# Patient Record
Sex: Male | Born: 1955 | Race: White | Hispanic: No | Marital: Married | Smoking: Never smoker
Health system: Southern US, Community
[De-identification: ages and names within clinical notes are randomized; demographics above are authoritative.]

## PROBLEM LIST (undated history)

## (undated) DIAGNOSIS — Z950 Presence of cardiac pacemaker: Secondary | ICD-10-CM

## (undated) DIAGNOSIS — I495 Sick sinus syndrome: Secondary | ICD-10-CM

## (undated) DIAGNOSIS — K648 Other hemorrhoids: Secondary | ICD-10-CM

## (undated) DIAGNOSIS — M519 Unspecified thoracic, thoracolumbar and lumbosacral intervertebral disc disorder: Secondary | ICD-10-CM

## (undated) DIAGNOSIS — I493 Ventricular premature depolarization: Secondary | ICD-10-CM

## (undated) DIAGNOSIS — Z85828 Personal history of other malignant neoplasm of skin: Secondary | ICD-10-CM

## (undated) DIAGNOSIS — E785 Hyperlipidemia, unspecified: Secondary | ICD-10-CM

## (undated) DIAGNOSIS — C801 Malignant (primary) neoplasm, unspecified: Secondary | ICD-10-CM

## (undated) DIAGNOSIS — C44319 Basal cell carcinoma of skin of other parts of face: Secondary | ICD-10-CM

## (undated) DIAGNOSIS — T4145XA Adverse effect of unspecified anesthetic, initial encounter: Secondary | ICD-10-CM

## (undated) DIAGNOSIS — T8859XA Other complications of anesthesia, initial encounter: Secondary | ICD-10-CM

## (undated) DIAGNOSIS — Z9889 Other specified postprocedural states: Secondary | ICD-10-CM

## (undated) DIAGNOSIS — I499 Cardiac arrhythmia, unspecified: Secondary | ICD-10-CM

## (undated) HISTORY — DX: Other specified postprocedural states: Z98.890

## (undated) HISTORY — DX: Presence of cardiac pacemaker: Z95.0

## (undated) HISTORY — DX: Other hemorrhoids: K64.8

## (undated) HISTORY — PX: INSERT / REPLACE / REMOVE PACEMAKER: SUR710

## (undated) HISTORY — DX: Sick sinus syndrome: I49.5

## (undated) HISTORY — PX: EXCISIONAL HEMORRHOIDECTOMY: SHX1541

## (undated) HISTORY — DX: Unspecified thoracic, thoracolumbar and lumbosacral intervertebral disc disorder: M51.9

## (undated) HISTORY — DX: Malignant (primary) neoplasm, unspecified: C80.1

## (undated) HISTORY — DX: Cardiac arrhythmia, unspecified: I49.9

## (undated) HISTORY — DX: Personal history of other malignant neoplasm of skin: Z85.828

## (undated) HISTORY — DX: Hyperlipidemia, unspecified: E78.5

## (undated) HISTORY — DX: Ventricular premature depolarization: I49.3

## (undated) HISTORY — PX: NECK SURGERY: SHX720

---

## 1996-03-29 HISTORY — PX: OTHER SURGICAL HISTORY: SHX169

## 2002-12-18 ENCOUNTER — Ambulatory Visit (HOSPITAL_COMMUNITY): Admission: RE | Admit: 2002-12-18 | Discharge: 2002-12-18 | Payer: Self-pay | Admitting: Neurosurgery

## 2002-12-18 ENCOUNTER — Encounter: Payer: Self-pay | Admitting: Neurosurgery

## 2002-12-18 HISTORY — PX: BACK SURGERY: SHX140

## 2003-01-28 HISTORY — PX: CHOLECYSTECTOMY: SHX55

## 2003-11-28 HISTORY — PX: INCISION AND DRAINAGE PERIRECTAL ABSCESS: SHX1804

## 2004-02-24 ENCOUNTER — Ambulatory Visit: Payer: Self-pay | Admitting: General Surgery

## 2006-05-05 ENCOUNTER — Ambulatory Visit: Payer: Self-pay | Admitting: Rheumatology

## 2007-01-20 ENCOUNTER — Ambulatory Visit: Payer: Self-pay | Admitting: Unknown Physician Specialty

## 2008-10-02 ENCOUNTER — Ambulatory Visit: Payer: Self-pay | Admitting: Rheumatology

## 2009-01-23 ENCOUNTER — Inpatient Hospital Stay: Payer: Self-pay | Admitting: Rheumatology

## 2009-01-24 HISTORY — PX: OTHER SURGICAL HISTORY: SHX169

## 2011-05-28 DIAGNOSIS — C44319 Basal cell carcinoma of skin of other parts of face: Secondary | ICD-10-CM

## 2011-05-28 HISTORY — DX: Basal cell carcinoma of skin of other parts of face: C44.319

## 2012-10-19 HISTORY — PX: COLONOSCOPY: SHX174

## 2015-04-09 DIAGNOSIS — Z950 Presence of cardiac pacemaker: Secondary | ICD-10-CM | POA: Insufficient documentation

## 2015-04-09 DIAGNOSIS — I495 Sick sinus syndrome: Secondary | ICD-10-CM | POA: Insufficient documentation

## 2015-04-09 DIAGNOSIS — I493 Ventricular premature depolarization: Secondary | ICD-10-CM | POA: Insufficient documentation

## 2015-04-12 ENCOUNTER — Emergency Department
Admission: EM | Admit: 2015-04-12 | Discharge: 2015-04-12 | Disposition: A | Discharge status: Home / Self Care | Payer: BC Managed Care – PPO | Attending: Emergency Medicine | Admitting: Emergency Medicine

## 2015-04-12 ENCOUNTER — Encounter: Payer: Self-pay | Admitting: Emergency Medicine

## 2015-04-12 DIAGNOSIS — Z8719 Personal history of other diseases of the digestive system: Secondary | ICD-10-CM | POA: Diagnosis not present

## 2015-04-12 DIAGNOSIS — K610 Anal abscess: Secondary | ICD-10-CM | POA: Diagnosis not present

## 2015-04-12 DIAGNOSIS — K6289 Other specified diseases of anus and rectum: Secondary | ICD-10-CM | POA: Diagnosis present

## 2015-04-12 MED ORDER — LIDOCAINE-EPINEPHRINE (PF) 1 %-1:200000 IJ SOLN
30.0000 mL | Freq: Once | INTRAMUSCULAR | Status: AC
  Administered 2015-04-12: 30 mL

## 2015-04-12 MED ORDER — LIDOCAINE-EPINEPHRINE (PF) 1 %-1:200000 IJ SOLN
INTRAMUSCULAR | Status: AC
  Administered 2015-04-12: 30 mL
  Filled 2015-04-12: qty 30

## 2015-04-12 NOTE — ED Notes (Addendum)
MD Dr. Tamala Julian at bedside for examination. Pt noted to have tenderness on palpation to R perianal area. No gross redness/swelling/heat noted.

## 2015-04-12 NOTE — Op Note (Signed)
Diagnosis perianal abscess  Procedure incision and drainage perianal abscess  Anesthesia local 1% Xylocaine with epinephrine  Surgeon Rochel Brome M.D.  With the patient on the stretcher in the left lateral decubitus position the anal area was prepared with Betadine solution and used a fenestrated drape. The right buttock was retracted for exposure. The site of tenderness and firmness just to the right of the anal os was infiltrated with 1% Xylocaine with epinephrine. A lancing 1 cm incision was made and lanced deeply as far as 2 cm into the hard palpable mass there was bloody discharge no actual yellow pus was seen. Further exploration was carried out with spreading with a hemostat and lanced  2 additional times into the firm area. There was bloody discharge and pressure was held for several minutes and with time it appeared that bleeding resolved.  The patient was allowed to rest on the stretcher in the same position for another 10 minutes and then change the dressing and found no further bleeding. The site was cleaned with saline and apply dry cotton gauze with 2 inch paper tape  The patient appear to be in satisfactory condition and plans made for discharge to home with follow-up in the office.

## 2015-04-12 NOTE — ED Notes (Signed)
I&D done by Dr. Tamala Julian, no purulent drainage noted at this time. Small amount bleeding, gauze applied, pressure held. Bleeding stopped at this time. 4x4 gauze and paper tape dressing applied by MD.

## 2015-04-12 NOTE — Discharge Instructions (Signed)
Change dressing later today or tomorrow. May sit in warm water as desired for comfort and cleaning. Change dressing once or twice a day until completely dry.  Take Tylenol and/or ibuprofen if needed for pain.  Call office for follow-up if pain persists.  Call office for follow-up if bleeding persists to consider roll of rubber band ligation

## 2015-04-12 NOTE — Progress Notes (Signed)
He comes into the emergency room with chief complaint of right side perianal pain. He reports that in the last 2 days has developed pain at this site. He has had an abscess 2 times in the past which required incision and drainage. He has previous history of anal fissurectomy and lateral internal anal sphincterotomy. He has a previous history of multiple anal skin tags. He was in the office 2 days ago for evaluation of pain. He did have some history of bright red rectal bleeding. An anoscopy was done 2 days ago demonstrating enlargement of internal hemorrhoids. He reports difficulty sitting and has to raise up on the left side. He has moved his bowels 2 times yesterday without constipation. He does often have some scant leakage of feculent material.  On examination he is awake alert and oriented and in minimal distress. Temperature 90.8, blood pressure 132/76, pulse rate 77. Anal examination demonstrated small bowel large and small external skin tag's. There is tenderness on the patient's right side and a localized area of firmness. Is no erythema of the skin. Digital examination demonstrates good sphincter tone and no palpable mass  Impression possible perianal abscess  I recommended incision and drainage  See operative report.  Final diagnosis perianal abscess, history of rectal bleeding and internal hemorrhoids  Plans include dressing changes at home sitz baths take Tylenol and or ibuprofen if needed follow-up in the office as needed.  We have previously discussed the role of internal hemorrhoid rubber band ligation but decision not yet made about rubber band ligation

## 2015-04-12 NOTE — ED Notes (Signed)
Per report, discharge instructions done by Shirlee Limerick, RN. This nurse in room with patient when Dr. Tamala Julian gave his home care instructions. Pt has no further questions at this time.

## 2015-04-12 NOTE — ED Provider Notes (Signed)
I was not involved in the care of this patient. This patient was taken care of by Dr. Rochel Brome, general surgery. I did not perform a history or physical exam on this patient.  Earleen Newport, MD 04/12/15 [phone]

## 2015-04-12 NOTE — ED Notes (Signed)
Pt to ed with c/o rectal pain and bleeding x 1 week.  Pt states he was seen by dr Tamala Julian this past week

## 2015-04-13 ENCOUNTER — Emergency Department: Payer: BC Managed Care – PPO

## 2015-04-13 ENCOUNTER — Emergency Department
Admission: EM | Admit: 2015-04-13 | Discharge: 2015-04-13 | Disposition: A | Discharge status: Home / Self Care | Payer: BC Managed Care – PPO | Attending: Emergency Medicine | Admitting: Emergency Medicine

## 2015-04-13 DIAGNOSIS — Z7982 Long term (current) use of aspirin: Secondary | ICD-10-CM | POA: Diagnosis not present

## 2015-04-13 DIAGNOSIS — K610 Anal abscess: Secondary | ICD-10-CM

## 2015-04-13 DIAGNOSIS — K611 Rectal abscess: Secondary | ICD-10-CM | POA: Diagnosis present

## 2015-04-13 LAB — BASIC METABOLIC PANEL
Anion gap: 5 (ref 5–15)
BUN: 14 mg/dL (ref 6–20)
CO2: 26 mmol/L (ref 22–32)
Calcium: 8.7 mg/dL — ABNORMAL LOW (ref 8.9–10.3)
Chloride: 107 mmol/L (ref 101–111)
Creatinine, Ser: 0.96 mg/dL (ref 0.61–1.24)
GFR calc Af Amer: >60 mL/min (ref >60)
GFR calc non Af Amer: >60 mL/min (ref >60)
Glucose, Bld: 113 mg/dL — ABNORMAL HIGH (ref 65–99)
Potassium: 3.9 mmol/L (ref 3.5–5.1)
Sodium: 138 mmol/L (ref 135–145)

## 2015-04-13 LAB — CBC WITH DIFFERENTIAL/PLATELET
Basophils Absolute: 0.1 10*3/uL (ref 0–0.1)
Basophils Relative: 1 %
Eosinophils Absolute: 0.2 10*3/uL (ref 0–0.7)
Eosinophils Relative: 1 %
HCT: 43.7 % (ref 40.0–52.0)
Hemoglobin: 14.5 g/dL (ref 13.0–18.0)
Lymphocytes Relative: 8 %
Lymphs Abs: 1.3 10*3/uL (ref 1.0–3.6)
MCH: 27.6 pg (ref 26.0–34.0)
MCHC: 33.2 g/dL (ref 32.0–36.0)
MCV: 83 fL (ref 80.0–100.0)
Monocytes Absolute: 1.3 10*3/uL — ABNORMAL HIGH (ref 0.2–1.0)
Monocytes Relative: 8 %
Neutro Abs: 12.8 10*3/uL — ABNORMAL HIGH (ref 1.4–6.5)
Neutrophils Relative %: 82 %
Platelets: 162 10*3/uL (ref 150–440)
RBC: 5.26 MIL/uL (ref 4.40–5.90)
RDW: 14.1 % (ref 11.5–14.5)
WBC: 15.6 10*3/uL — ABNORMAL HIGH (ref 3.8–10.6)

## 2015-04-13 MED ORDER — LIDOCAINE-EPINEPHRINE (PF) 1 %-1:200000 IJ SOLN
INTRAMUSCULAR | Status: AC
  Administered 2015-04-13: 30 mL
  Filled 2015-04-13: qty 30

## 2015-04-13 MED ORDER — HYDROMORPHONE HCL 1 MG/ML IJ SOLN
1.0000 mg | Freq: Once | INTRAMUSCULAR | Status: AC
  Administered 2015-04-13: 1 mg via INTRAVENOUS
  Filled 2015-04-13: qty 1

## 2015-04-13 MED ORDER — IOHEXOL 300 MG/ML  SOLN
100.0000 mL | Freq: Once | INTRAMUSCULAR | Status: AC | PRN
  Administered 2015-04-13: 100 mL via INTRAVENOUS

## 2015-04-13 MED ORDER — LIDOCAINE-EPINEPHRINE (PF) 2 %-1:200000 IJ SOLN
20.0000 mL | Freq: Once | INTRAMUSCULAR | Status: DC
  Filled 2015-04-13: qty 20

## 2015-04-13 MED ORDER — IOHEXOL 240 MG/ML SOLN
25.0000 mL | INTRAMUSCULAR | Status: AC
  Administered 2015-04-13 (×2): 25 mL via ORAL

## 2015-04-13 MED ORDER — ONDANSETRON HCL 4 MG/2ML IJ SOLN
4.0000 mg | Freq: Once | INTRAMUSCULAR | Status: AC
  Administered 2015-04-13: 4 mg via INTRAVENOUS
  Filled 2015-04-13: qty 2

## 2015-04-13 NOTE — ED Notes (Addendum)
Pt seen her yesterday for perirectal abscess. States she had it I&D yesterday with minimal drainage. Denies fever. Denies blood in stool. Continued pain and swelling

## 2015-04-13 NOTE — ED Notes (Signed)
Pt transported to CT ?

## 2015-04-13 NOTE — Progress Notes (Signed)
He returned to the emergency room today with chief complaint of anal pain. He was here yesterday and had an incision and drainage of perianal abscess but only obtained bloody discharge. He has continued to have pain. He was initially seen by the emergency room staff and referred for surgical consultation. He had CT scan today which I have reviewed the images which demonstrated a 2.5 cm abscess at 9:00 position in the perianal area. He has had a small bowel movement since yesterday. He reports no chills or fever. He has received an injection of Dilaudid which help to ease the pain.  On examination he was awake alert and oriented and in a mild degree of distress. External anal appearance demonstrated swelling on the right side. There were multiple skin tags. There was no erythema identified. There was point tenderness at approximately 9:00 position of the anoderm. Ultrasound was used to examine this area and did demonstrate an area of hypoechogenicity.  CBC with a white blood count of 15,000  Diagnosis perianal abscess  Incision and drainage was accomplished. See operative report.  Instructions were given for wound care to include dressing changes and sitting in warm water. Plan is to follow-up in the office in 10 days to remove a Penrose drain.

## 2015-04-13 NOTE — Discharge Instructions (Signed)
Perianal Abscess °An abscess is an infected area that contains a collection of pus and debris. A perianal abscess is one that occurs in the perineal area, which is the area between the anus and the scrotum in males and between the anus and the vagina in females. Perianal abscesses can vary in size. Without treatment, a perianal abscess can become larger and cause other problems. °CAUSES  °Glands in the perineal area can become plugged up with debris. When this happens, an abscess may form.  °SIGNS AND SYMPTOMS  °The most common symptoms of a perianal abscess are: °· Swelling and redness in the area of the abscess. The redness may go beyond the abscess and appear as a red streak on the skin. °· Pain in the area of the abscess. °Other possible symptoms include:  °· A visible lump or a lump that can be felt when touching the area and is usually painful. °· Bleeding or pus-like discharge from the area. °· Fever. °· General weakness. °DIAGNOSIS  °Your health care provider will take a medical history and examine the area. This may involve examining the rectal area with a gloved hand (digital rectal exam). For women, it may require a careful vaginal exam. Sometimes, the health care provider needs to look into the rectum using a probe or scope. °TREATMENT  °Treatment often requires making a cut (incision) in the abscess to drain the pus. This can sometimes be done in your health care provider's office or an emergency department after giving you medicine to numb the area (local anesthetic). For larger or deeper abscesses, surgery may be required to drain the abscess. Antibiotic medicines are sometimes given if there is infection of the surrounding tissue (cellulitis). In some cases, gauze is packed into the abscess to continue draining the area. Frequent sitz baths may be recommended to help the wound heal and to reduce the chance of the abscess coming back. °HOME CARE INSTRUCTIONS  °· Only take over-the-counter or  prescription medicines for pain, fever, or discomfort as directed by your health care provider. °· Take antibiotic medicine as directed. Make sure you finish it even if you start to feel better. °· If gauze is used in the abscess, follow your health care provider's instructions for removing or changing the gauze. It can usually be removed in 2-3 days. °· If one or more drains have been placed in the abscess cavity, be careful not to pull at them. Your health care provider will tell you how long they need to remain in place. °· Take warm sitz baths 3-4 times a day and after bowel movements. This will help reduce pain and swelling. °· Keep the skin around the abscess clean and dry. Avoid cleaning the area too much. °· Avoid scratching the abscess area. °· Avoid using colored or perfumed toilet papers. °SEEK MEDICAL CARE IF:  °· You have trouble having a bowel movement or passing urine. °· Your pain or swelling in the affected area does not seem to be improving. °· The gauze packing or the drains come out before the planned time. °SEEK IMMEDIATE MEDICAL CARE IF:  °· You have problems moving or using your legs. °· You have severe or increasing pain. °· Your swelling in the affected area suddenly gets worse. °· You have a large increase in bleeding or passing of pus. °· You have chills or a fever. °MAKE SURE YOU:  °· Understand these instructions. °· Will watch your condition. °· Will get help right away if you are   not doing well or get worse.   This information is not intended to replace advice given to you by your health care provider. Make sure you discuss any questions you have with your health care provider.   Document Released: 04/21/2006 Document Revised: 01/03/2013 Document Reviewed: 10/25/2012 Elsevier Interactive Patient Education 2016 [place] dressing either later today or tomorrow.  May sit in warm water and or shower.  Taut gauze or pad into underwear and change as needed for  drainage.

## 2015-04-13 NOTE — Op Note (Signed)
OPERATIVE REPORT  PREOPERATIVE  DIAGNOSIS: . Perianal abscess  POSTOPERATIVE DIAGNOSIS: . Perianal abscess  PROCEDURE: . Incision and drainage of perianal abscess  ANESTHESIA:  General  SURGEON: Rochel Brome  MD   INDICATIONS: . Localized pain and tenderness. CT findings of perianal abscess  With the patient on the stretcher in the right lateral decubitus position the anal area was prepared with Betadine solution. A towel was placed at the dependent portion of the operative field. The left buttock was retracted for exposure. The anoderm was infiltrated with 1% Xylocaine with epinephrine. A [place] incision was made just approximately 1 cm from the previous incision and incise deeply approximately 2 cm to discover of abrupt drainage of a yellow brown and black foul-smelling pus. There was approximately 4-5 cm of purulent drainage. Minimal bleeding. The tract was dilated with a hemostat. A quarter-inch Penrose drain was inserted and held in place with a 3-0 nylon stitch. The site was cleaned with saline and dressed with folded cotton gauze and 2 inch paper tape  The patient tolerated the procedure satisfactorily and discharged plans were made.

## 2015-04-13 NOTE — ED Notes (Signed)
Pt reports perianal abscess.  Seen yesterday and i&d by surgeon.  No improvement.  Skin  W/d with good color, ambulates well.

## 2015-04-13 NOTE — ED Provider Notes (Signed)
Time Seen: Approximately *0933  I have reviewed the triage notes  Chief Complaint: Abscess   History of Present Illness: Bob Schmidt is a 60 y.o. male *who states he was here yesterday and had incision and drainage of what was felt to be a perirectal abscess. Review of the note shows very little drainage and the patient was discharged home. Patient's currently not on any antibiotics. He states the area of the was swollen and seems to have gotten larger and more painful. He states he has difficulty finding a comfortable position at home. He denies any fever at this time with normal bowel movements, normal urination. He has a surgical history of an anal fissure or repair and states that he's had difficulties in that perirectal area since that time. Patient was also seen in the office and had an anoscopic the and exam done at that time. His surgeon is Dr. Rochel Brome   Past Medical History  Diagnosis Date  . PVC (premature ventricular contraction)   . Sinus node dysfunction (HCC)   . Cardiac pacemaker   . Hyperlipidemia   . Lumbar disc disease   . Internal hemorrhoids   . History of basal cell carcinoma   . H/O basal cell carcinoma excision   . Arrhythmia   . Cancer [facility])     Patient Active Problem List   Diagnosis Date Noted  . PVC (premature ventricular contraction)   . Sinus node dysfunction (HCC)   . Cardiac pacemaker     Past Surgical History  Procedure Laterality Date  . Anal fissure repair with partial lateral sphincterotomy  1998  . Back surgery  12/18/2002  . Incision and drainage perirectal abscess  11/2003  . Insert/replace/remove pacemaker  01/24/2009  . Colonoscopy  10/19/2012  . Cholecystectomy  01/2003  . Insert / replace / remove pacemaker      Past Surgical History  Procedure Laterality Date  . Anal fissure repair with partial lateral sphincterotomy  1998  . Back surgery  12/18/2002  . Incision and drainage perirectal abscess  11/2003  .  Insert/replace/remove pacemaker  01/24/2009  . Colonoscopy  10/19/2012  . Cholecystectomy  01/2003  . Insert / replace / remove pacemaker      Current Outpatient Rx  Name  Route  Sig  Dispense  Refill  . aspirin EC 81 MG tablet   Oral   Take 81 mg by mouth daily.           Allergies:  Review of patient's allergies indicates no known allergies.  Family History: Family History  Problem Relation Age of Onset  . Sudden death Father   . Diabetes type II Sister     Social History: Social History  Substance Use Topics  . Smoking status: Never Smoker   . Smokeless tobacco: Never Used  . Alcohol Use: Yes     Review of Systems:   10 point review of systems was performed and was otherwise negative:  Constitutional: No fever Eyes: No visual disturbances ENT: No sore throat, ear pain Cardiac: No chest pain Respiratory: No shortness of breath, wheezing, or stridor Abdomen: No abdominal pain, no vomiting, No diarrhea Endocrine: No weight loss, No night sweats Extremities: No peripheral edema, cyanosis Skin: No rashes, easy bruising Neurologic: No focal weakness, trouble with speech or swollowing Urologic: No dysuria, Hematuria, or urinary frequency  Physical Exam:  ED Triage Vitals  Enc Vitals Group     BP 04/13/15 0920 112/83 mmHg  Pulse Rate 04/13/15 0920 110     Resp 04/13/15 0920 16     Temp 04/13/15 0920 97.6 F (36.4 C)     Temp Source 04/13/15 0920 Oral     SpO2 04/13/15 0920 98 %     Weight 04/13/15 0920 230 lb (104.327 kg)     Height 04/13/15 0920 6\' 1"  (1.854 m)     Head Cir --      Peak Flow --      Pain Score 04/13/15 0920 7     Pain Loc --      Pain Edu? --      Excl. in [place]? --     General: Awake , Alert , and Oriented times 3; GCS 15 Head: Normal cephalic , atraumatic Eyes: Pupils equal , round, reactive to light Nose/Throat: No nasal drainage, patent upper airway without erythema or exudate.  Neck: Supple, Full range of motion, No  anterior adenopathy or palpable thyroid masses Lungs: Clear to ascultation without wheezes , rhonchi, or rales Heart: Regular rate, regular rhythm without murmurs , gallops , or rubs Abdomen: Soft, non tender without rebound, guarding , or rigidity; bowel sounds positive and symmetric in all 4 quadrants. No organomegaly .        Extremities: 2 plus symmetric pulses. No edema, clubbing or cyanosis Neurologic: normal ambulation, Motor symmetric without deficits, sensory intact Skin: warm, dry, no rashes Rectal exam shows a large indurated area on the right side. Approximately 7 x 3 cm in size. Tender without any signs of erythema or active drainage. Large left-sided skin tag.  Labs:   All laboratory work was reviewed including any pertinent negatives or positives listed below:  Labs Reviewed  [place] DIFFERENTIAL/PLATELET   Laboratory work shows no elevated white blood cell count  ED Course Radiology CT Abdomen Pelvis W Contrast (Final result) Result time: 04/13/15 13:46:36   Final result by Rad Results In Interface (04/13/15 13:46:36)   Narrative:   CLINICAL DATA: Perirectal abscess. Worsening abdominal and pelvic pain status post incision and drainage yesterday.  EXAM: CT ABDOMEN AND PELVIS WITH CONTRAST  TECHNIQUE: Multidetector CT imaging of the abdomen and pelvis was performed using the standard protocol following bolus administration of intravenous contrast.  CONTRAST: 159mL OMNIPAQUE IOHEXOL 300 MG/ML SOLN  COMPARISON: None.  FINDINGS: Lower chest: No acute findings  Hepatobiliary: No masses or other significant abnormality. Prior cholecystectomy noted. No evidence of biliary dilatation.  Pancreas: No mass, inflammatory changes, or other significant abnormality.  Spleen: Within normal limits in size and appearance.  Adrenals/Urinary Tract: No masses identified. No evidence of hydronephrosis.  Stomach/Bowel: No evidence of  obstruction, inflammatory process, or abnormal fluid collections. Normal appendix visualized.  Vascular/Lymphatic: No pathologically enlarged lymph nodes. No evidence of abdominal aortic aneurysm.  Reproductive: No mass or other significant abnormality.  Other: An irregular perianal fluid collection measuring approximately 2.2 x 2.3 cm is seen extending from the 6 to 9 o'clock positions, which is consistent with a perianal abscess. There is adjacent inflammatory changes in the subcutaneous tissues of the right buttock.  Musculoskeletal: No suspicious bone lesions identified.  IMPRESSION: 2.3 cm right-sided perianal abscess extending from 6-9 o'clock positions, with adjacent inflammatory changes.    Consultation was established with Dr. Tamala Julian who has come in and we drained the area of his abscess and did get some purulent material on this occasion. Patient feels symptomatically improved be discharged with follow-up to Dr. Tamala Julian   Assessment:  .  Anal abscess   Final Clinical Impression:  . Anal abscess Final diagnoses:  None     Plan: Outpatient management Patient was advised to return immediately if condition worsens. Patient was advised to follow up with their primary care physician or other specialized physicians involved in their outpatient care            Daymon Larsen, MD 04/13/15 [phone]

## 2015-04-17 ENCOUNTER — Ambulatory Visit: Payer: Self-pay | Admitting: Internal Medicine

## 2015-04-17 ENCOUNTER — Encounter: Payer: Self-pay | Admitting: Internal Medicine

## 2015-04-17 ENCOUNTER — Ambulatory Visit (INDEPENDENT_AMBULATORY_CARE_PROVIDER_SITE_OTHER): Payer: BC Managed Care – PPO | Admitting: Internal Medicine

## 2015-04-17 VITALS — BP 118/72 | HR 68 | Ht 73.0 in | Wt 234.8 lb

## 2015-04-17 DIAGNOSIS — I495 Sick sinus syndrome: Secondary | ICD-10-CM | POA: Diagnosis not present

## 2015-04-17 DIAGNOSIS — Z950 Presence of cardiac pacemaker: Secondary | ICD-10-CM | POA: Diagnosis not present

## 2015-04-17 NOTE — Patient Instructions (Signed)
Medication Instructions: - no changes  Labwork: - none  Procedures/Testing: - none  Follow-Up: - Remote monitoring is used to monitor your Pacemaker of ICD from home. This monitoring reduces the number of office visits required to check your device to one time per year. It allows [place] to keep an eye on the functioning of your device to ensure it is working properly. You are scheduled for a device check from home on 07/17/15. You may send your transmission at any time that day. If you have a wireless device, the transmission will be sent automatically. After your physician reviews your transmission, you will receive a postcard with your next transmission date.  - Your physician wants you to follow-up in: October 2017 with Dr. Caryl Comes. You will receive a reminder letter in the mail two months in advance. If you don't receive a letter, please call our office to schedule the follow-up appointment.  Any Additional Special Instructions Will Be Listed Below (If Applicable).     If you need a refill on your cardiac medications before your next appointment, please call your pharmacy.

## 2015-04-17 NOTE — Progress Notes (Signed)
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  He has a history of nocturnal sleep associated sinus node dysfunction for which he underwent pacemaker implantation 10/10.  He also has a history of symptomatic PVCs which have been ameliorated by the pacing.  He denies a history of sleep apnea. He does not have hypertension. He denies problems with exercise tolerance. He has no chest pain nocturnal dyspnea or peripheral edema. He has had no tachycardia palpitations.  He has a history of symptomatic PVCs as well as device detected atrial arrhythmia.    Past Medical History  Diagnosis Date  . PVC (premature ventricular contraction)   . Sinus node dysfunction (HCC)   . Cardiac pacemaker   . Hyperlipidemia   . Lumbar disc disease   . Internal hemorrhoids   . History of basal cell carcinoma   . H/O basal cell carcinoma excision   . Arrhythmia   . Cancer [facility])       Surgical History:  Past Surgical History  Procedure Laterality Date  . Anal fissure repair with partial lateral sphincterotomy  1998  . Back surgery  12/18/2002  . Incision and drainage perirectal abscess  11/2003  . Insert/replace/remove pacemaker  01/24/2009  . Colonoscopy  10/19/2012  . Cholecystectomy  01/2003  . Insert / replace / remove pacemaker       Home Meds: Prior to Admission medications   Medication Sig Start Date End Date Taking? Authorizing Provider  aspirin EC 81 MG tablet Take 81 mg by mouth daily.    Historical Provider, MD    Allergies: No Known Allergies  Social History   Social History  . Marital Status: Married    Spouse Name: N/A  . Number of Children: N/A  .  Years of Education: N/A   Occupational History  . Not on file.   Social History Main Topics  . Smoking status: Never Smoker   . Smokeless tobacco: Never Used  . Alcohol Use: Yes  . Drug Use: Not on file  . Sexual Activity: Not on file   Other Topics Concern  . Not on file   Social History Narrative     Family History  Problem Relation Age of Onset  . Sudden death Father   . Diabetes type II Sister      ROS:  Please see the history of present illness.     All other systems reviewed and negative.    Physical Exam: Blood pressure 118/72, pulse 68, height 6\' 1"  (1.854 m), weight 234 lb 12.8 oz (106.505 kg). General: Well developed, well nourished male in no acute distress. Head: Normocephalic, atraumatic, sclera non-icteric, no xanthomas, nares are without discharge. EENT: normal  Lymph Nodes:  none Neck: Negative for carotid bruits. JVD not elevated. Back:without scoliosis kyphosis  Lungs: Clear bilaterally to auscultation without wheezes, rales, or rhonchi. Breathing is unlabored. Heart: RRR with S1 S2. No ** murmur . No rubs, or gallops appreciated. Abdomen: Soft, non-tender, non-distended with normoactive bowel sounds. No hepatomegaly. No rebound/guarding. No obvious abdominal masses. Msk:  Strength and tone appear normal for age. Extremities: No clubbing or cyanosis. N  edema.  Distal pedal pulses are 2+ and  equal bilaterally. Skin: Warm and Dry Neuro: Alert and oriented X 3. CN III-XII intact Grossly normal sensory and motor function . Psych:  Responds to questions appropriately with a normal affect.      Labs: Cardiac Enzymes No results for input(s): CKTOTAL, CKMB, TROPONINI in the last 72 hours. CBC Lab Results  Component Value Date   WBC 15.6* 04/13/2015   HGB 14.5 04/13/2015   HCT 43.7 04/13/2015   MCV 83.0 04/13/2015   PLT 162 04/13/2015   PROTIME: No results for input(s): LABPROT, INR in the last 72 hours. Chemistry   Recent Labs Lab  04/13/15 1021  NA 138  K 3.9  CL 107  CO2 26  BUN 14  CREATININE 0.96  CALCIUM 8.7*  GLUCOSE 113*   Lipids No results found for: CHOL, HDL, LDLCALC, TRIG BNP No results found for: PROBNP Thyroid Function Tests: No results for input(s): TSH, T4TOTAL, T3FREE, THYROIDAB in the last 72 hours.  Invalid input(s): FREET3 Miscellaneous No results found for: DDIMER  Radiology/Studies:  Ct Abdomen Pelvis W Contrast  04/13/2015  CLINICAL DATA:  Perirectal abscess. Worsening abdominal and pelvic pain status post incision and drainage yesterday. EXAM: CT ABDOMEN AND PELVIS WITH CONTRAST TECHNIQUE: Multidetector CT imaging of the abdomen and pelvis was performed using the standard protocol following bolus administration of intravenous contrast. CONTRAST:  172mL OMNIPAQUE IOHEXOL 300 MG/ML  SOLN COMPARISON:  None. FINDINGS: Lower chest:  No acute findings Hepatobiliary: No masses or other significant abnormality. Prior cholecystectomy noted. No evidence of biliary dilatation. Pancreas: No mass, inflammatory changes, or other significant abnormality. Spleen: Within normal limits in size and appearance. Adrenals/Urinary Tract: No masses identified. No evidence of hydronephrosis. Stomach/Bowel: No evidence of obstruction, inflammatory process, or abnormal fluid collections. Normal appendix visualized. Vascular/Lymphatic: No pathologically enlarged lymph nodes. No evidence of abdominal aortic aneurysm. Reproductive: No mass or other significant abnormality. Other: An irregular perianal fluid collection measuring approximately 2.2 x 2.3 cm is seen extending from the 6 to 9 o'clock positions, which is consistent with a perianal abscess. There is adjacent inflammatory changes in the subcutaneous tissues of the right buttock. Musculoskeletal:  No suspicious bone lesions identified. IMPRESSION: 2.3 cm right-sided perianal abscess extending from 6-9 o'clock positions, with adjacent inflammatory changes.  Electronically Signed   By: Earle Gell M.D.   On: 04/13/2015 13:46    EKG:  Sinus rhythm at 68 Intervals 15/08/37 Otherwise normal Assessment and Plan:  Nocturnal pausing  Pacemaker-Medtronic  [name] device function is normal. We will decrease his lower pacing rate control rate response as his issues were   nocturnal. This raises the specter of  sleep apnea. I suggested he discuss with his PCP a sleep study. I will be in touch with him.      Virl Axe

## 2015-07-17 ENCOUNTER — Ambulatory Visit (INDEPENDENT_AMBULATORY_CARE_PROVIDER_SITE_OTHER): Payer: BC Managed Care – PPO | Admitting: *Deleted

## 2015-07-17 ENCOUNTER — Telehealth: Payer: Self-pay | Admitting: Cardiology

## 2015-07-17 DIAGNOSIS — I495 Sick sinus syndrome: Secondary | ICD-10-CM

## 2015-07-17 NOTE — Progress Notes (Signed)
Remote pacemaker transmission.   

## 2015-07-17 NOTE — Telephone Encounter (Signed)
LMOVM reminding pt to send remote transmission.   

## 2015-07-18 ENCOUNTER — Encounter: Payer: Self-pay | Admitting: Internal Medicine

## 2015-07-18 NOTE — Telephone Encounter (Signed)
PT AWARE  EKG  FAXED  AS REQUESTED ./CY

## 2015-07-21 HISTORY — PX: PLACEMENT OF SETON: SHX6029

## 2015-08-22 ENCOUNTER — Encounter: Payer: Self-pay | Admitting: Cardiology

## 2015-08-22 LAB — CUP PACEART REMOTE DEVICE CHECK
Date Time Interrogation Session: 20170526144803
Implantable Lead Implant Date: 20101029
Implantable Lead Implant Date: 20101029
Implantable Lead Location: 753859
Implantable Lead Location: 753860
Implantable Lead Model: 5076
Implantable Lead Model: 5076
Lead Channel Impedance Value: 410 Ohm
Lead Channel Impedance Value: 419 Ohm
Lead Channel Pacing Threshold Amplitude: 0.5 V
Lead Channel Pacing Threshold Amplitude: 0.625 V
Lead Channel Pacing Threshold Pulse Width: 0.4 ms
Lead Channel Pacing Threshold Pulse Width: 0.4 ms
Lead Channel Sensing Intrinsic Amplitude: 5.6 mV

## 2015-10-10 ENCOUNTER — Encounter: Payer: Self-pay | Admitting: Internal Medicine

## 2015-10-15 ENCOUNTER — Encounter: Payer: Self-pay | Admitting: Cardiology

## 2016-01-15 ENCOUNTER — Encounter: Payer: Self-pay | Admitting: Internal Medicine

## 2016-01-22 ENCOUNTER — Other Ambulatory Visit: Payer: Self-pay | Admitting: Neurosurgery

## 2016-01-22 DIAGNOSIS — M5412 Radiculopathy, cervical region: Secondary | ICD-10-CM

## 2016-01-29 ENCOUNTER — Encounter: Payer: Self-pay | Admitting: Internal Medicine

## 2016-01-29 ENCOUNTER — Ambulatory Visit (INDEPENDENT_AMBULATORY_CARE_PROVIDER_SITE_OTHER): Payer: BC Managed Care – PPO | Admitting: Internal Medicine

## 2016-01-29 VITALS — BP 114/66 | HR 69 | Ht 74.0 in | Wt 239.3 lb

## 2016-01-29 DIAGNOSIS — I495 Sick sinus syndrome: Secondary | ICD-10-CM | POA: Diagnosis not present

## 2016-01-29 DIAGNOSIS — Z950 Presence of cardiac pacemaker: Secondary | ICD-10-CM | POA: Diagnosis not present

## 2016-01-29 NOTE — Progress Notes (Signed)
      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  He has a history of nocturnal sleep associated sinus node dysfunction for which he underwent pacemaker implantation 10/10.   He also has a history of symptomatic PVCs which have been ameliorated by the pacing.  He denies a history of sleep apnea. He does not have hypertension. He denies problems with exercise tolerance. He has no chest pain nocturnal dyspnea or peripheral edema. He has had no tachypalpitations.  He has a history of symptomatic PVCs as well as device detected atrial arrhythmia.  Records and Results Reviewed  Outside labs 10./17 normal CBC TSH BMET  Past Medical History:  Diagnosis Date  . Arrhythmia   . Cancer ([place])   . Cardiac pacemaker   . H/O basal cell carcinoma excision   . History of basal cell carcinoma   . Hyperlipidemia   . Internal hemorrhoids   . Lumbar disc disease   . PVC (premature ventricular contraction)   . Sinus node dysfunction [facility])     Past Surgical History:  Procedure Laterality Date  . ANAL FISSURE REPAIR WITH PARTIAL LATERAL SPHINCTEROTOMY  1998  . BACK SURGERY  12/18/2002  . CHOLECYSTECTOMY  01/2003  . COLONOSCOPY  10/19/2012  . INCISION AND DRAINAGE PERIRECTAL ABSCESS  11/2003  . INSERT / REPLACE / REMOVE PACEMAKER    . INSERT/REPLACE/REMOVE PACEMAKER  01/24/2009    Current Outpatient Prescriptions  Medication Sig Dispense Refill  . aspirin EC 81 MG tablet Take 81 mg by mouth daily.     No current facility-administered medications for this visit.     No Known Allergies    Review of Systems negative except from HPI and PMH  Physical Exam BP 114/66   Pulse 69   Ht 6\' 2"  (1.88 m)   Wt 239 lb 4.8 oz (108.5 kg)   SpO2 97%   BMI 30.72 kg/m  Well developed and well nourished in no acute distress HENT normal E scleral and icterus clear Neck Supple JVP  flat; carotids brisk and full Clear to ausculation Device pocket well healed; without hematoma or erythema.  There is no tethering Regular rate and rhythm, no murmurs gallops or rub Soft with active bowel sounds No clubbing cyanosis  Edema Alert and oriented, grossly normal motor and sensory function Skin Warm and Dry  ECG personally reviewed Normal intervals and segments  Assessment and  Plan  SINUS NODE Dysfunction   medtronic pacemaker  [name] device was interrogated.  The information was reviewed. No changes were made in the programming.     Desires to exercise and lose weight.  With normla ECG will undertake screening treadmill test     Current medicines are reviewed at length with the patient today .  The patient does not  have concerns regarding medicines.

## 2016-01-29 NOTE — Patient Instructions (Signed)
Your physician recommends that you continue on your current medications as directed. Please refer to the Current Medication list given to you today.    Your physician wants you to follow up    Galt will receive a reminder letter in the mail two months in advance. If you don't receive a letter, please call our office to schedule the follow-up appointment.

## 2016-02-06 ENCOUNTER — Ambulatory Visit
Admission: RE | Admit: 2016-02-06 | Discharge: 2016-02-06 | Disposition: A | Discharge status: Home / Self Care | Payer: BC Managed Care – PPO | Source: Ambulatory Visit | Attending: Neurosurgery | Admitting: Neurosurgery

## 2016-02-06 DIAGNOSIS — M5412 Radiculopathy, cervical region: Secondary | ICD-10-CM

## 2016-02-06 MED ORDER — ONDANSETRON HCL 4 MG/2ML IJ SOLN: 4.0000 mg | Freq: Four times a day (QID) | INTRAMUSCULAR | Status: DC | PRN

## 2016-02-06 MED ORDER — IOPAMIDOL (ISOVUE-M 300) INJECTION 61%
10.0000 mL | Freq: Once | INTRAMUSCULAR | Status: AC | PRN
Start: 2016-02-06 — End: 2016-02-06
  Administered 2016-02-06: 10 mL via INTRATHECAL

## 2016-02-06 MED ORDER — DIAZEPAM 5 MG PO TABS: 10.0000 mg | ORAL_TABLET | Freq: Once | ORAL | Status: DC

## 2016-02-06 NOTE — Discharge Instructions (Signed)

## 2016-02-09 ENCOUNTER — Telehealth: Payer: Self-pay

## 2016-02-09 NOTE — Telephone Encounter (Signed)
Spoke with patient after he had a myelogram here 02/06/16.  He states he is fine and did not develop a headache.  jkl

## 2016-02-23 ENCOUNTER — Encounter: Payer: Self-pay | Admitting: Internal Medicine

## 2016-03-23 ENCOUNTER — Inpatient Hospital Stay (HOSPITAL_COMMUNITY)
Admission: EM | Admit: 2016-03-23 | Discharge: 2016-03-30 | DRG: 857 | Disposition: A | Discharge status: Home Health | Payer: BC Managed Care – PPO | Attending: Neurosurgery | Admitting: Neurosurgery

## 2016-03-23 ENCOUNTER — Emergency Department (HOSPITAL_COMMUNITY): Payer: BC Managed Care – PPO | Admitting: Anesthesiology

## 2016-03-23 ENCOUNTER — Emergency Department (HOSPITAL_COMMUNITY): Payer: BC Managed Care – PPO

## 2016-03-23 ENCOUNTER — Encounter (HOSPITAL_COMMUNITY)
Admission: EM | Disposition: A | Discharge status: Home Health | Payer: Self-pay | Source: Home / Self Care | Attending: Neurosurgery

## 2016-03-23 DIAGNOSIS — E785 Hyperlipidemia, unspecified: Secondary | ICD-10-CM | POA: Diagnosis present

## 2016-03-23 DIAGNOSIS — T814XXA Infection following a procedure, initial encounter: Principal | ICD-10-CM | POA: Diagnosis present

## 2016-03-23 DIAGNOSIS — R7881 Bacteremia: Secondary | ICD-10-CM

## 2016-03-23 DIAGNOSIS — L0211 Cutaneous abscess of neck: Secondary | ICD-10-CM | POA: Diagnosis present

## 2016-03-23 DIAGNOSIS — B9561 Methicillin susceptible Staphylococcus aureus infection as the cause of diseases classified elsewhere: Secondary | ICD-10-CM

## 2016-03-23 DIAGNOSIS — Z85828 Personal history of other malignant neoplasm of skin: Secondary | ICD-10-CM | POA: Diagnosis not present

## 2016-03-23 DIAGNOSIS — Z950 Presence of cardiac pacemaker: Secondary | ICD-10-CM | POA: Diagnosis not present

## 2016-03-23 DIAGNOSIS — L0291 Cutaneous abscess, unspecified: Secondary | ICD-10-CM

## 2016-03-23 DIAGNOSIS — T8149XA Infection following a procedure, other surgical site, initial encounter: Secondary | ICD-10-CM | POA: Diagnosis present

## 2016-03-23 DIAGNOSIS — I340 Nonrheumatic mitral (valve) insufficiency: Secondary | ICD-10-CM | POA: Diagnosis not present

## 2016-03-23 DIAGNOSIS — Z833 Family history of diabetes mellitus: Secondary | ICD-10-CM | POA: Diagnosis not present

## 2016-03-23 DIAGNOSIS — S1093XA Contusion of unspecified part of neck, initial encounter: Secondary | ICD-10-CM | POA: Diagnosis present

## 2016-03-23 DIAGNOSIS — R609 Edema, unspecified: Secondary | ICD-10-CM | POA: Diagnosis present

## 2016-03-23 DIAGNOSIS — Y838 Other surgical procedures as the cause of abnormal reaction of the patient, or of later complication, without mention of misadventure at the time of the procedure: Secondary | ICD-10-CM | POA: Diagnosis not present

## 2016-03-23 HISTORY — PX: ESOPHAGOSCOPY: SHX5534

## 2016-03-23 HISTORY — PX: WOUND EXPLORATION: SHX6188

## 2016-03-23 HISTORY — DX: Presence of cardiac pacemaker: Z95.0

## 2016-03-23 LAB — URINALYSIS, ROUTINE W REFLEX MICROSCOPIC
Bilirubin Urine: NEGATIVE
Glucose, UA: NEGATIVE mg/dL
Hgb urine dipstick: NEGATIVE
Ketones, ur: 5 mg/dL — AB
Leukocytes, UA: NEGATIVE
Nitrite: NEGATIVE
Protein, ur: NEGATIVE mg/dL
Specific Gravity, Urine: 1.013 (ref 1.005–1.030)
pH: 6 (ref 5.0–8.0)

## 2016-03-23 LAB — COMPREHENSIVE METABOLIC PANEL
ALT: 57 U/L (ref 17–63)
AST: 31 U/L (ref 15–41)
Albumin: 4.5 g/dL (ref 3.5–5.0)
Alkaline Phosphatase: 97 U/L (ref 38–126)
Anion gap: 11 (ref 5–15)
BUN: 9 mg/dL (ref 6–20)
CO2: 28 mmol/L (ref 22–32)
Calcium: 10 mg/dL (ref 8.9–10.3)
Chloride: 101 mmol/L (ref 101–111)
Creatinine, Ser: 1.14 mg/dL (ref 0.61–1.24)
GFR calc Af Amer: >60 mL/min (ref >60)
GFR calc non Af Amer: >60 mL/min (ref >60)
Glucose, Bld: 111 mg/dL — ABNORMAL HIGH (ref 65–99)
Potassium: 4.1 mmol/L (ref 3.5–5.1)
Sodium: 140 mmol/L (ref 135–145)
Total Bilirubin: 1.6 mg/dL — ABNORMAL HIGH (ref 0.3–1.2)
Total Protein: 8 g/dL (ref 6.5–8.1)

## 2016-03-23 LAB — PROTIME-INR
INR: 1.08
Prothrombin Time: 14 seconds (ref 11.4–15.2)

## 2016-03-23 LAB — CBC WITH DIFFERENTIAL/PLATELET
Basophils Absolute: 0.1 10*3/uL (ref 0.0–0.1)
Basophils Relative: 0 %
Eosinophils Absolute: 0.3 10*3/uL (ref 0.0–0.7)
Eosinophils Relative: 2 %
HCT: 48.8 % (ref 39.0–52.0)
Hemoglobin: 16.7 g/dL (ref 13.0–17.0)
Lymphocytes Relative: 9 %
Lymphs Abs: 2 10*3/uL (ref 0.7–4.0)
MCH: 29.8 pg (ref 26.0–34.0)
MCHC: 34.2 g/dL (ref 30.0–36.0)
MCV: 87 fL (ref 78.0–100.0)
Monocytes Absolute: 1.4 10*3/uL — ABNORMAL HIGH (ref 0.1–1.0)
Monocytes Relative: 6 %
Neutro Abs: 18.8 10*3/uL — ABNORMAL HIGH (ref 1.7–7.7)
Neutrophils Relative %: 83 %
Platelets: 202 10*3/uL (ref 150–400)
RBC: 5.61 MIL/uL (ref 4.22–5.81)
RDW: 13.4 % (ref 11.5–15.5)
WBC: 22.7 10*3/uL — ABNORMAL HIGH (ref 4.0–10.5)

## 2016-03-23 LAB — I-STAT CG4 LACTIC ACID, ED: Lactic Acid, Venous: 1.73 mmol/L (ref 0.5–1.9)

## 2016-03-23 SURGERY — WOUND EXPLORATION
Anesthesia: General | Site: Neck

## 2016-03-23 MED ORDER — ONDANSETRON HCL 4 MG/2ML IJ SOLN: 4.0000 mg | INTRAMUSCULAR | Status: DC | PRN

## 2016-03-23 MED ORDER — ROCURONIUM BROMIDE 100 MG/10ML IV SOLN
INTRAVENOUS | Status: DC | PRN
  Administered 2016-03-23 (×2): 30 mg via INTRAVENOUS

## 2016-03-23 MED ORDER — DEXAMETHASONE SODIUM PHOSPHATE 10 MG/ML IJ SOLN
INTRAMUSCULAR | Status: DC | PRN
  Administered 2016-03-23: 10 mg via INTRAVENOUS

## 2016-03-23 MED ORDER — FENTANYL CITRATE (PF) 100 MCG/2ML IJ SOLN
INTRAMUSCULAR | Status: DC | PRN
  Administered 2016-03-23: 50 ug via INTRAVENOUS

## 2016-03-23 MED ORDER — PROMETHAZINE HCL 25 MG/ML IJ SOLN: 6.2500 mg | INTRAMUSCULAR | Status: DC | PRN

## 2016-03-23 MED ORDER — SODIUM CHLORIDE 0.9 % IV SOLN: 250.0000 mL | INTRAVENOUS | Status: DC

## 2016-03-23 MED ORDER — MIDAZOLAM HCL 2 MG/2ML IJ SOLN
INTRAMUSCULAR | Status: DC | PRN
  Administered 2016-03-23: 2 mg via INTRAVENOUS

## 2016-03-23 MED ORDER — CEFAZOLIN SODIUM-DEXTROSE 2-3 GM-% IV SOLR
INTRAVENOUS | Status: DC | PRN
  Administered 2016-03-23: 2 g via INTRAVENOUS

## 2016-03-23 MED ORDER — SENNA 8.6 MG PO TABS
1.0000 | ORAL_TABLET | Freq: Two times a day (BID) | ORAL | Status: DC
  Administered 2016-03-24 – 2016-03-30 (×4): 8.6 mg via ORAL
  Filled 2016-03-23 (×9): qty 1

## 2016-03-23 MED ORDER — ONDANSETRON HCL 4 MG/2ML IJ SOLN
INTRAMUSCULAR | Status: DC | PRN
  Administered 2016-03-23: 4 mg via INTRAVENOUS

## 2016-03-23 MED ORDER — SUCCINYLCHOLINE CHLORIDE 20 MG/ML IJ SOLN
INTRAMUSCULAR | Status: DC | PRN
  Administered 2016-03-23: 140 mg via INTRAVENOUS

## 2016-03-23 MED ORDER — ZOLPIDEM TARTRATE 5 MG PO TABS
5.0000 mg | ORAL_TABLET | Freq: Every evening | ORAL | Status: DC | PRN
  Administered 2016-03-24 – 2016-03-26 (×3): 5 mg via ORAL
  Filled 2016-03-23 (×3): qty 1

## 2016-03-23 MED ORDER — SODIUM CHLORIDE 0.9 % IV SOLN
INTRAVENOUS | Status: DC
  Administered 2016-03-23: 22:00:00 via INTRAVENOUS
  Administered 2016-03-30: 500 mL via INTRAVENOUS

## 2016-03-23 MED ORDER — ONDANSETRON HCL 4 MG/2ML IJ SOLN
INTRAMUSCULAR | Status: AC
  Filled 2016-03-23: qty 2

## 2016-03-23 MED ORDER — MENTHOL 3 MG MT LOZG
1.0000 | LOZENGE | OROMUCOSAL | Status: DC | PRN
  Filled 2016-03-23: qty 9

## 2016-03-23 MED ORDER — THROMBIN 20000 UNITS EX SOLR
CUTANEOUS | Status: AC
  Filled 2016-03-23: qty 20000

## 2016-03-23 MED ORDER — LACTATED RINGERS IV SOLN
INTRAVENOUS | Status: DC | PRN
  Administered 2016-03-23 (×2): via INTRAVENOUS

## 2016-03-23 MED ORDER — MIDAZOLAM HCL 2 MG/2ML IJ SOLN
INTRAMUSCULAR | Status: AC
  Filled 2016-03-23: qty 2

## 2016-03-23 MED ORDER — SODIUM CHLORIDE 0.9% FLUSH
3.0000 mL | Freq: Two times a day (BID) | INTRAVENOUS | Status: DC
  Administered 2016-03-23 – 2016-03-30 (×13): 3 mL via INTRAVENOUS

## 2016-03-23 MED ORDER — PHENOL 1.4 % MT LIQD: 1.0000 | OROMUCOSAL | Status: DC | PRN

## 2016-03-23 MED ORDER — SUGAMMADEX SODIUM 500 MG/5ML IV SOLN
INTRAVENOUS | Status: DC | PRN
  Administered 2016-03-23: 500 mg via INTRAVENOUS

## 2016-03-23 MED ORDER — ACETAMINOPHEN 650 MG RE SUPP: 650.0000 mg | RECTAL | Status: DC | PRN

## 2016-03-23 MED ORDER — THROMBIN 5000 UNITS EX SOLR
CUTANEOUS | Status: AC
  Filled 2016-03-23: qty 5000

## 2016-03-23 MED ORDER — OXYCODONE-ACETAMINOPHEN 5-325 MG PO TABS
1.0000 | ORAL_TABLET | ORAL | Status: DC | PRN
  Administered 2016-03-25 (×2): 2 via ORAL
  Filled 2016-03-23 (×2): qty 2

## 2016-03-23 MED ORDER — CEFAZOLIN IN D5W 1 GM/50ML IV SOLN
1.0000 g | Freq: Three times a day (TID) | INTRAVENOUS | Status: DC
  Administered 2016-03-24: 1 g via INTRAVENOUS
  Filled 2016-03-23 (×2): qty 50

## 2016-03-23 MED ORDER — CYCLOBENZAPRINE HCL 10 MG PO TABS
10.0000 mg | ORAL_TABLET | Freq: Three times a day (TID) | ORAL | Status: DC | PRN
  Administered 2016-03-27 – 2016-03-30 (×7): 10 mg via ORAL
  Filled 2016-03-23 (×7): qty 1

## 2016-03-23 MED ORDER — MORPHINE SULFATE (PF) 2 MG/ML IV SOLN
1.0000 mg | INTRAVENOUS | Status: DC | PRN
  Administered 2016-03-24: 2 mg via INTRAVENOUS
  Filled 2016-03-23: qty 1

## 2016-03-23 MED ORDER — HYDROMORPHONE HCL 1 MG/ML IJ SOLN: 0.2500 mg | INTRAMUSCULAR | Status: DC | PRN

## 2016-03-23 MED ORDER — ACETAMINOPHEN 325 MG PO TABS
650.0000 mg | ORAL_TABLET | ORAL | Status: DC | PRN
  Administered 2016-03-25 – 2016-03-30 (×9): 650 mg via ORAL
  Filled 2016-03-23 (×9): qty 2

## 2016-03-23 MED ORDER — SODIUM CHLORIDE 0.9% FLUSH: 3.0000 mL | INTRAVENOUS | Status: DC | PRN

## 2016-03-23 MED ORDER — DEXAMETHASONE SODIUM PHOSPHATE 10 MG/ML IJ SOLN
INTRAMUSCULAR | Status: AC
  Filled 2016-03-23: qty 1

## 2016-03-23 MED ORDER — THROMBIN 20000 UNITS EX KIT
PACK | CUTANEOUS | Status: DC | PRN
  Administered 2016-03-23: 20 mL via TOPICAL

## 2016-03-23 MED ORDER — 0.9 % SODIUM CHLORIDE (POUR BTL) OPTIME
TOPICAL | Status: DC | PRN
  Administered 2016-03-23: 1000 mL

## 2016-03-23 MED ORDER — PROPOFOL 10 MG/ML IV BOLUS
INTRAVENOUS | Status: DC | PRN
  Administered 2016-03-23: 200 mg via INTRAVENOUS

## 2016-03-23 MED ORDER — ASPIRIN EC 81 MG PO TBEC
81.0000 mg | DELAYED_RELEASE_TABLET | Freq: Every day | ORAL | Status: DC
  Administered 2016-03-24 – 2016-03-30 (×7): 81 mg via ORAL
  Filled 2016-03-23 (×7): qty 1

## 2016-03-23 MED ORDER — FENTANYL CITRATE (PF) 100 MCG/2ML IJ SOLN
INTRAMUSCULAR | Status: AC
  Filled 2016-03-23: qty 2

## 2016-03-23 MED ORDER — SUGAMMADEX SODIUM 500 MG/5ML IV SOLN
INTRAVENOUS | Status: AC
  Filled 2016-03-23: qty 5

## 2016-03-23 MED ORDER — SODIUM CHLORIDE 0.9 % IV BOLUS (SEPSIS): 1000.0000 mL | Freq: Once | INTRAVENOUS | Status: DC

## 2016-03-23 SURGICAL SUPPLY — 24 items
BENZOIN TINCTURE PRP APPL 2/3 (GAUZE/BANDAGES/DRESSINGS) ×3 IMPLANT
CLOTH BEACON ORANGE TIMEOUT ST (SAFETY) ×3 IMPLANT
CONT SPEC STER OR (MISCELLANEOUS) ×6 IMPLANT
CORDS BIPOLAR (ELECTRODE) ×3 IMPLANT
DRAPE INCISE IOBAN 66X45 STRL (DRAPES) ×3 IMPLANT
DRAPE LAPAROTOMY 100X72 PEDS (DRAPES) ×6 IMPLANT
DRSG OPSITE POSTOP 4X6 (GAUZE/BANDAGES/DRESSINGS) ×3 IMPLANT
GLOVE BIO SURGEON STRL SZ7 (GLOVE) ×9 IMPLANT
GLOVE BIO SURGEON STRL SZ7.5 (GLOVE) ×3 IMPLANT
GLOVE BIOGEL M 8.0 STRL (GLOVE) ×6 IMPLANT
GLOVE BIOGEL PI IND STRL 7.5 (GLOVE) ×6 IMPLANT
GLOVE BIOGEL PI INDICATOR 7.5 (GLOVE) ×3
GOWN STRL REUS W/ TWL LRG LVL3 (GOWN DISPOSABLE) ×10 IMPLANT
GOWN STRL REUS W/TWL LRG LVL3 (GOWN DISPOSABLE) ×5
KIT BASIN OR (CUSTOM PROCEDURE TRAY) ×3 IMPLANT
KIT ROOM TURNOVER OR (KITS) ×3 IMPLANT
PACK LAMINECTOMY NEURO (CUSTOM PROCEDURE TRAY) ×3 IMPLANT
PENCIL BUTTON HOLSTER BLD 10FT (ELECTRODE) ×3 IMPLANT
STRIP CLOSURE SKIN 1/2X4 (GAUZE/BANDAGES/DRESSINGS) ×3 IMPLANT
STRIP SURGICAL 1/2 X 6 IN (GAUZE/BANDAGES/DRESSINGS) ×3 IMPLANT
SUT VIC AB 3-0 SH 8-18 (SUTURE) ×6 IMPLANT
TOWEL OR 17X24 6PK STRL BLUE (TOWEL DISPOSABLE) ×3 IMPLANT
TOWEL OR 17X26 10 PK STRL BLUE (TOWEL DISPOSABLE) ×3 IMPLANT
TUBE CONNECTING 12X1/4 (SUCTIONS) ×3 IMPLANT

## 2016-03-23 NOTE — ED Provider Notes (Signed)
[facility] DEPT Provider Note   CSN: [id number] Arrival date & time: 03/23/16  1704     History   Chief Complaint Chief Complaint  Patient presents with  . Airway Obstruction  . Fever  . Post-op Problem  . 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 Patient had surgery by Dr. Trenton Gammon with a cervical fusion with anterior approach. Patient's been having worsening difficulty swallowing, a fever last night, and swelling in his anterior neck. On arrival to ED neurosurgery coverage was meeting patient in advocate for him to get a CT immediately.  Patient handling his secretions but difficulty swallowing fluids.  Code sepsis orders were initiated.  Past Medical History:  Diagnosis Date  . Arrhythmia   . Cancer ([place])   . Cardiac pacemaker   . H/O basal cell carcinoma excision   . History of basal cell carcinoma   . Hyperlipidemia   . Internal hemorrhoids   . Lumbar disc disease   . PVC (premature ventricular contraction)   . Sinus node dysfunction [facility])     Patient Active Problem List   Diagnosis Date Noted  . PVC (premature ventricular contraction)   . Sinus node dysfunction (HCC)   . Cardiac pacemaker     Past Surgical History:  Procedure Laterality Date  . ANAL FISSURE REPAIR WITH PARTIAL LATERAL SPHINCTEROTOMY  1998  . BACK SURGERY  12/18/2002  . CHOLECYSTECTOMY  01/2003  . COLONOSCOPY  10/19/2012  . INCISION AND DRAINAGE PERIRECTAL ABSCESS  11/2003  . INSERT / REPLACE / REMOVE PACEMAKER    . INSERT/REPLACE/REMOVE PACEMAKER  01/24/2009       Home Medications    Prior to Admission medications   Medication Sig Start Date End Date Taking? Authorizing Provider  aspirin EC 81 MG tablet Take 81 mg by mouth daily.    Historical Provider, MD    Family History Family History  Problem Relation Age of Onset  . Sudden death Father   . Diabetes type II Sister      Social History Social History  Substance Use Topics  . Smoking status: Never Smoker  . Smokeless tobacco: Never Used  . Alcohol use Yes     Allergies   Patient has no known allergies.   Review of Systems Review of Systems  Constitutional: Positive for fatigue and fever.  HENT: Negative for drooling and ear discharge.   Respiratory: Negative for shortness of breath.   Cardiovascular: Negative for chest pain.  Gastrointestinal: Negative for abdominal pain.     Physical Exam Updated Vital Signs BP 141/89   Pulse (!) 123   Temp 98.3 F (36.8 C)   Resp 18   SpO2 96%   Physical Exam  Constitutional: He is oriented to person, place, and time. He appears well-nourished.  HENT:  Head: Normocephalic and atraumatic.  Large swelling to anterior neck with overlying erythema. Concerning for abscess.  Eyes: Conjunctivae are normal. Right eye exhibits no discharge. Left eye exhibits no discharge.  Pulmonary/Chest: Effort normal.  Neurological: He is oriented to person, place, and time.  Skin: Skin is warm and dry. He is not diaphoretic.  Psychiatric: He has a normal mood and affect. His behavior is normal.     ED Treatments / Results  Labs (all labs ordered are listed, but only abnormal results are displayed) Labs Reviewed  COMPREHENSIVE METABOLIC PANEL - Abnormal; Notable for  the following:       Result Value   Glucose, Bld 111 (*)    Total Bilirubin 1.6 (*)    All other components within normal limits  CBC WITH DIFFERENTIAL/PLATELET - Abnormal; Notable for the following:    WBC 22.7 (*)    Neutro Abs 18.8 (*)    Monocytes Absolute 1.4 (*)    All other components within normal limits  CULTURE, BLOOD (ROUTINE X 2)  CULTURE, BLOOD (ROUTINE X 2)  URINE CULTURE  PROTIME-INR  URINALYSIS, ROUTINE W REFLEX MICROSCOPIC  I-STAT CG4 LACTIC ACID, ED    EKG  EKG Interpretation None       Radiology Ct Soft Tissue Neck Wo Contrast  Result Date:  03/23/2016 CLINICAL DATA:  Postoperative swelling. EXAM: CT NECK WITHOUT CONTRAST TECHNIQUE: Multidetector CT imaging of the neck was performed following the standard protocol without intravenous contrast. COMPARISON:  CT myelogram 02/06/2016 FINDINGS: Pharynx and larynx: Fluid density with areas of gas and peripheral punctate high density extending from the retropharynx right lateral around the central compartment ot the skin surface in the ventral right neck. There is likely submucosal edema on the posterior pharyngeal wall. The pharynx and larynx is displaced anteriorly and to the left and moderately narrowed. Inflammation tracks along the esophagus at the thoracic inlet. Due to irregular size, the collection is difficult to quantify. The subcutaneous portion alone measures up to 6 cm in diameter. Salivary glands: Negative Thyroid: Negative Lymph nodes: No notable enlargement. Vascular: Negative Limited intracranial: Negative Visualized orbits: Non encompassed. Mastoids and visualized paranasal sinuses: Negative where seen Skeleton: C4-5, C5-6, C6-7 ACDF with ventral plate and screw. The hardware is intact and there is no acute fracture. Upper chest: No inflammation beyond the thoracic inlet. Clear apical lungs. Case discussed between Dr. Jola Baptist and Dr. Joya Salm in person at time of imaging. IMPRESSION: Large surgical bed fluid collection with pharyngeal and laryngeal mass effect and narrowing. Postoperative abscess, hematoma/seroma, or CSF leak could have this appearance. If an abscess, would consider esophageal injury. C4-5, C5-6, and C6-7 ACDF hardware is unremarkable. Electronically Signed   By: Monte Fantasia M.D.   On: 03/23/2016 18:59    Procedures Procedures (including critical care time)  Medications Ordered in ED Medications - No data to display   Initial Impression / Assessment and Plan / ED Course  I have reviewed the triage vital signs and the nursing notes.  Pertinent labs & imaging  results that were available during my care of the patient were reviewed by me and considered in my medical decision making (see chart for details).  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 Suspicious for abscess. Bedside ultrasound done. Patient went to CT which shows abscess. Patient will go to operating room.  EMERGENCY DEPARTMENT US SOFT TISSUE INTERPRETATION "Study: Limited Soft Tissue Ultrasound"  INDICATIONS: Soft tissue infection Multiple views of the body part were obtained in real-time with a multi-frequency linear probe PERFORMED BY:  Myself IMAGES ARCHIVED?: Yes SIDE:Midline BODY PART:Neck FINDINGS: Abcess present INTERPRETATION:  Abcess present    Final Clinical Impressions(s) / ED Diagnoses   Final diagnoses:  None    New Prescriptions New Prescriptions   No medications on file     Rai Severns Julio Alm, MD 03/23/16 1911

## 2016-03-23 NOTE — Progress Notes (Signed)
Patient ID: Bob Chiquito, MD, male   DOB: 1955/08/26, 60 y.o.   MRN: [id number] Exploration of cervical wound. Report no [id number]

## 2016-03-23 NOTE — Transfer of Care (Signed)
Immediate Anesthesia Transfer of Care Note  Patient: Bob Chiquito, MD  Procedure(s) Performed: Procedure(s): Incision and Drainage of Cervical Wound (N/A) ESOPHAGOSCOPY (N/A)  Patient Location: PACU  Anesthesia Type:General  Level of Consciousness: awake, alert  and oriented  Airway & Oxygen Therapy: Patient connected to face mask oxygen  Post-op Assessment: Report given to RN, Post -op Vital signs reviewed and stable and Patient moving all extremities X 4  Post vital signs: Reviewed and stable  Last Vitals:  Vitals:   03/23/16 1909 03/23/16 1910  BP: 124/75   Pulse:  (!) 125  Resp: 22 19  Temp:      Last Pain:  Vitals:   03/23/16 1747  PainSc: 2          Complications: No apparent anesthesia complications

## 2016-03-23 NOTE — ED Triage Notes (Signed)
Pt reports surgery 12 days ago, went through front of neck. Reports difficulty swallowing. Temp 101 after tylenol at home. 98.3 at triage. Swelling with redness and firmness noted to surgical site to front of neck. MD Trenton Gammon request pt to ED for CT scan.

## 2016-03-23 NOTE — Op Note (Signed)
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 Joya Salm, and he was found to have a pus filled cavity adjacent to the cervical body plates. I was consulted intraoperatively to examine the upper aerodigestive tract to identify if there was an injury to the esophagus. During the induction of anesthesia and the intubation, there was never any pus identified.  A maxillary and mandibular tooth protector was used throughout the examination. A rigid cervical esophagoscope was entered into the oral cavity, passed through the pharynx, through the cricopharyngeus and into the cervical esophagus, beyond the level of the cervical spine surgery. The scope was slowly withdrawn while suctioning secretions. There is no purulence obtained. There is some old blood but there is no evidence of an esophageal tear. During my manipulation of the esophagus with the scope Dr. Joya Salm was palpating within the wound and was able to feel the scope through the esophagus but was not able to see it or to feel any exposed portion of the scope. The scope was removed.  No further treatment was necessary on my part. The care of the patient was returned to Dr. Joya Salm.    PATIENT DISPOSITION:  To PACU, stable

## 2016-03-23 NOTE — H&P (Signed)
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   Chief Complaint: neck post-op swelling HPI: 12 days ago patient underwent a 3 level acdf by dr Annette Stable as outpatient procedure. Since then he had had some dysphagia and pain with numbness in both hands. Last night his temperature went up to over 101 , two hours after he took some tylenol.this pm his swallowing Is getting worse  With more swelling  Past Medical History:  Diagnosis Date  . Arrhythmia   . Cancer ([place])   . Cardiac pacemaker   . H/O basal cell carcinoma excision   . History of basal cell carcinoma   . Hyperlipidemia   . Internal hemorrhoids   . Lumbar disc disease   . PVC (premature ventricular contraction)   . Sinus node dysfunction [facility])     Past Surgical History:  Procedure Laterality Date  . ANAL FISSURE REPAIR WITH PARTIAL LATERAL SPHINCTEROTOMY  1998  . BACK SURGERY  12/18/2002  . CHOLECYSTECTOMY  01/2003  . COLONOSCOPY  10/19/2012  . INCISION AND DRAINAGE PERIRECTAL ABSCESS  11/2003  . INSERT / REPLACE / REMOVE PACEMAKER    . INSERT/REPLACE/REMOVE PACEMAKER  01/24/2009    Family History  Problem Relation Age of Onset  . Sudden death Father   . Diabetes type II Sister    Social History:  reports that he has never smoked. He has never used smokeless tobacco. He reports that he drinks alcohol. He reports that he does not use drugs.  Allergies: No Known Allergies   (Not in a hospital admission)  Results for orders placed or performed during the hospital encounter of 03/23/16 (from the past 48 hour(s))  CBC with Differential     Status: Abnormal   Collection Time: 03/23/16  5:55 PM  Result Value Ref Range   WBC 22.7 (H) 4.0 - 10.5 K/uL   RBC 5.61 4.22 - 5.81 MIL/uL   Hemoglobin 16.7 13.0 - 17.0 g/dL   HCT 48.8 39.0 - 52.0 %   MCV 87.0 78.0 - 100.0 fL   MCH 29.8 26.0 - 34.0 pg   MCHC 34.2 30.0 - 36.0 g/dL   RDW 13.4 11.5 - 15.5 %   Platelets 202 150 - 400 K/uL   Neutrophils Relative % 83 %   Neutro Abs 18.8 (H)  1.7 - 7.7 K/uL   Lymphocytes Relative 9 %   Lymphs Abs 2.0 0.7 - 4.0 K/uL   Monocytes Relative 6 %   Monocytes Absolute 1.4 (H) 0.1 - 1.0 K/uL   Eosinophils Relative 2 %   Eosinophils Absolute 0.3 0.0 - 0.7 K/uL   Basophils Relative 0 %   Basophils Absolute 0.1 0.0 - 0.1 K/uL  Protime-INR     Status: None   Collection Time: 03/23/16  5:55 PM  Result Value Ref Range   Prothrombin Time 14.0 11.4 - 15.2 seconds   INR 1.08   I-Stat CG4 Lactic Acid, ED     Status: None   Collection Time: 03/23/16  6:43 PM  Result Value Ref Range   Lactic Acid, Venous 1.73 0.5 - 1.9 mmol/L   Ct Soft Tissue Neck Wo Contrast  Result Date: 03/23/2016 CLINICAL DATA:  Postoperative swelling. EXAM: CT NECK WITHOUT CONTRAST TECHNIQUE: Multidetector CT imaging of the neck was performed following the standard protocol without intravenous contrast. COMPARISON:  CT myelogram 02/06/2016 FINDINGS: Pharynx and larynx: Fluid density with areas of gas and peripheral punctate high density extending from the retropharynx right lateral around the central compartment ot  the skin surface in the ventral right neck. There is likely submucosal edema on the posterior pharyngeal wall. The pharynx and larynx is displaced anteriorly and to the left and moderately narrowed. Inflammation tracks along the esophagus at the thoracic inlet. Due to irregular size, the collection is difficult to quantify. The subcutaneous portion alone measures up to 6 cm in diameter. Salivary glands: Negative Thyroid: Negative Lymph nodes: No notable enlargement. Vascular: Negative Limited intracranial: Negative Visualized orbits: Non encompassed. Mastoids and visualized paranasal sinuses: Negative where seen Skeleton: C4-5, C5-6, C6-7 ACDF with ventral plate and screw. The hardware is intact and there is no acute fracture. Upper chest: No inflammation beyond the thoracic inlet. Clear apical lungs. Case discussed between Dr. Jola Baptist and Dr. Joya Salm in person at time of  imaging. IMPRESSION: Large surgical bed fluid collection with pharyngeal and laryngeal mass effect and narrowing. Postoperative abscess, hematoma/seroma, or CSF leak could have this appearance. If an abscess, would consider esophageal injury. C4-5, C5-6, and C6-7 ACDF hardware is unremarkable. Electronically Signed   By: Monte Fantasia M.D.   On: 03/23/2016 18:59    Review of Systems  Constitutional: Positive for fever.  HENT: Negative.   Eyes: Negative.   Respiratory: Positive for cough.   Cardiovascular: Negative.   Gastrointestinal: Negative.   Genitourinary: Negative.   Musculoskeletal: Negative.   Skin: Negative.   Neurological: Positive for focal weakness.  Endo/Heme/Allergies: Negative.   Psychiatric/Behavioral: Negative.     Blood pressure 141/89, pulse (!) 123, temperature 98.3 F (36.8 C), resp. rate 18, SpO2 96 %. Physical Exam hent, nl. neck ,swelling with displacement of trachea to the left. No obvious findings of infection. Cv, pacemaker. Lungs.,clesr. Abdomen, nl. Extremities, nl. NEURO some numbness both c6 dermatomes with 4/5 weakness of biceps and wrist extensors bilaterally  Assessment/Plan Review ct with dr Jola Baptist. Possibilities, infection, csf leak or hematoma  . To or asap. spoke with him and wife  Floyce Stakes, MD 03/23/2016, 7:03 PM

## 2016-03-23 NOTE — ED Notes (Signed)
Transporting to CT

## 2016-03-23 NOTE — Anesthesia Procedure Notes (Signed)
Procedure Name: Intubation Date/Time: 03/23/2016 7:55 PM Performed by: Valetta Fuller Pre-anesthesia Checklist: Patient identified, Emergency Drugs available, Suction available and Patient being monitored Patient Re-evaluated:Patient Re-evaluated prior to inductionOxygen Delivery Method: Circle system utilized Preoxygenation: Pre-oxygenation with 100% oxygen Intubation Type: IV induction Ventilation: Mask ventilation without difficulty and Nasal airway inserted- appropriate to patient size Laryngoscope Size: Glidescope Grade View: Grade I Tube type: Reinforced Tube size: 6.0 mm Number of attempts: 1 Airway Equipment and Method: Stylet Placement Confirmation: ETT inserted through vocal cords under direct vision,  positive ETCO2 and breath sounds checked- equal and bilateral Secured at: 22 cm Tube secured with: Tape Dental Injury: Teeth and Oropharynx as per pre-operative assessment

## 2016-03-23 NOTE — Anesthesia Preprocedure Evaluation (Addendum)
Anesthesia Evaluation  Patient identified by MRN, date of birth, ID band Patient awake  General Assessment Comment:Pharynx and larynx: Fluid density with areas of gas and peripheral punctate high density extending from the retropharynx right lateral around the central compartment ot the skin surface in the ventral right neck. There is likely submucosal edema on the posterior pharyngeal wall. The pharynx and larynx is displaced anteriorly and to the left and moderately narrowed. Inflammation tracks along the esophagus at the thoracic inlet. Due to irregular size, the collection is difficult to quantify. The subcutaneous portion alone measures up to 6 cm in diameter.  Reviewed: Allergy & Precautions, NPO status , Patient's Chart, lab work & pertinent test results  Airway Mallampati: II  TM Distance: >3 FB Neck ROM: Full    Dental no notable dental hx.    Pulmonary neg pulmonary ROS,    Pulmonary exam normal breath sounds clear to auscultation       Cardiovascular Normal cardiovascular exam+ pacemaker  Rhythm:Regular Rate:Normal     Neuro/Psych negative neurological ROS  negative psych ROS   GI/Hepatic negative GI ROS, Neg liver ROS,   Endo/Other  negative endocrine ROS  Renal/GU negative Renal ROS  negative genitourinary   Musculoskeletal negative musculoskeletal ROS (+)   Abdominal   Peds negative pediatric ROS (+)  Hematology negative hematology ROS (+)   Anesthesia Other Findings   Reproductive/Obstetrics negative OB ROS                             Anesthesia Physical Anesthesia Plan  ASA: III and emergent  Anesthesia Plan: General   Post-op Pain Management:    Induction: Intravenous  Airway Management Planned: Oral ETT and Video Laryngoscope Planned  Additional Equipment:   Intra-op Plan:   Post-operative Plan: Possible Post-op intubation/ventilation  Informed Consent:  I have reviewed the patients History and Physical, chart, labs and discussed the procedure including the risks, benefits and alternatives for the proposed anesthesia with the patient or authorized representative who has indicated his/her understanding and acceptance.   Dental advisory given  Plan Discussed with: CRNA and Surgeon  Anesthesia Plan Comments: (Patient explained possible need for trach, and/or post-op ventilation. ENT consulted, trach tray in room)       Anesthesia Quick Evaluation

## 2016-03-23 NOTE — ED Notes (Signed)
Pt transporting to short stay, per Charge RN, someone will place IV there. Pt in NAD at this time.

## 2016-03-23 NOTE — Anesthesia Procedure Notes (Signed)
Date/Time: 03/23/2016 8:15 PM Performed by: Valetta Fuller Laryngoscope Size: Sabra Heck and 2 Grade View: Grade II Tube type: Oral Tube size: 7.5 mm Number of attempts: 1 Airway Equipment and Method: Stylet Secured at: 25 cm Tube secured with: Tape Dental Injury: Teeth and Oropharynx as per pre-operative assessment  Comments: ETT from induction became dislodged. DL with Sabra Heck 2 by TG, CRNA, Grade 2 view, intubated with 7.5 ETT without difficulty; +ETCO2, =BBS; Dr. Kalman Shan called and responded immediately.

## 2016-03-24 ENCOUNTER — Encounter (HOSPITAL_COMMUNITY): Payer: Self-pay | Admitting: Neurosurgery

## 2016-03-24 ENCOUNTER — Inpatient Hospital Stay (HOSPITAL_COMMUNITY): Payer: BC Managed Care – PPO

## 2016-03-24 DIAGNOSIS — L0211 Cutaneous abscess of neck: Secondary | ICD-10-CM

## 2016-03-24 DIAGNOSIS — Z8719 Personal history of other diseases of the digestive system: Secondary | ICD-10-CM

## 2016-03-24 DIAGNOSIS — Z9689 Presence of other specified functional implants: Secondary | ICD-10-CM

## 2016-03-24 DIAGNOSIS — Y838 Other surgical procedures as the cause of abnormal reaction of the patient, or of later complication, without mention of misadventure at the time of the procedure: Secondary | ICD-10-CM

## 2016-03-24 DIAGNOSIS — B9561 Methicillin susceptible Staphylococcus aureus infection as the cause of diseases classified elsewhere: Secondary | ICD-10-CM

## 2016-03-24 DIAGNOSIS — Z950 Presence of cardiac pacemaker: Secondary | ICD-10-CM

## 2016-03-24 DIAGNOSIS — T814XXA Infection following a procedure, initial encounter: Principal | ICD-10-CM

## 2016-03-24 LAB — BLOOD CULTURE ID PANEL (REFLEXED)

## 2016-03-24 LAB — HEPATIC FUNCTION PANEL
ALT: 71 U/L — ABNORMAL HIGH (ref 17–63)
AST: 51 U/L — ABNORMAL HIGH (ref 15–41)
Albumin: 3.6 g/dL (ref 3.5–5.0)
Alkaline Phosphatase: 92 U/L (ref 38–126)
Bilirubin, Direct: 0.3 mg/dL (ref 0.1–0.5)
Indirect Bilirubin: 0.8 mg/dL (ref 0.3–0.9)
Total Bilirubin: 1.1 mg/dL (ref 0.3–1.2)
Total Protein: 7.2 g/dL (ref 6.5–8.1)

## 2016-03-24 LAB — C-REACTIVE PROTEIN: CRP: 13.4 mg/dL — ABNORMAL HIGH (ref <1.0)

## 2016-03-24 LAB — URINE CULTURE: Culture: NO GROWTH

## 2016-03-24 LAB — MRSA PCR SCREENING: MRSA by PCR: NEGATIVE

## 2016-03-24 LAB — SEDIMENTATION RATE: Sed Rate: 41 mm/hr — ABNORMAL HIGH (ref 0–16)

## 2016-03-24 MED ORDER — RIFAMPIN 300 MG PO CAPS
300.0000 mg | ORAL_CAPSULE | Freq: Two times a day (BID) | ORAL | Status: DC
  Administered 2016-03-24 – 2016-03-30 (×13): 300 mg via ORAL
  Filled 2016-03-24 (×13): qty 1

## 2016-03-24 MED ORDER — CEFAZOLIN SODIUM-DEXTROSE 2-4 GM/100ML-% IV SOLN
2.0000 g | Freq: Three times a day (TID) | INTRAVENOUS | Status: DC
  Administered 2016-03-24 – 2016-03-30 (×19): 2 g via INTRAVENOUS
  Filled 2016-03-24 (×20): qty 100

## 2016-03-24 MED ORDER — IOPAMIDOL (ISOVUE-300) INJECTION 61%
INTRAVENOUS | Status: AC
  Filled 2016-03-24: qty 150

## 2016-03-24 MED ORDER — DIATRIZOATE MEGLUMINE & SODIUM 66-10 % PO SOLN
ORAL | Status: AC
  Filled 2016-03-24: qty 30

## 2016-03-24 MED FILL — Thrombin For Soln 20000 Unit: CUTANEOUS | Qty: 1 | Status: AC

## 2016-03-24 NOTE — Progress Notes (Signed)
Patient ID: Bob Chiquito, MD, male   DOB: 09-14-1955, 60 y.o.   MRN: [id number]  BA swallow reviewed, no leak, no need for ENT intervention.

## 2016-03-24 NOTE — Consult Note (Signed)
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 Since the procedure he has had numbness in his hands, dysphagia. By 12-25 he developed rigors. He returned to hospital on 12-26 with temp 101 and worsening dysphagia to the point he was unable to swallow liquids.  He was found to have  cervical abscess and had debridement 12-26.  He now also has positive BCx for staph aureus.   Past Medical History:  Diagnosis Date  . Arrhythmia   . Cancer ([place])   . Cardiac pacemaker   . H/O basal cell carcinoma excision   . History of basal cell carcinoma   . Hyperlipidemia   . Internal hemorrhoids   . Lumbar disc disease   . PVC (premature ventricular contraction)   . Sinus node dysfunction [facility])     Past Surgical History:  Procedure Laterality Date  . ANAL FISSURE REPAIR WITH PARTIAL LATERAL SPHINCTEROTOMY  1998  . BACK SURGERY  12/18/2002  . CHOLECYSTECTOMY  01/2003  . COLONOSCOPY  10/19/2012  . INCISION AND DRAINAGE PERIRECTAL ABSCESS  11/2003  . INSERT / REPLACE / REMOVE PACEMAKER    . INSERT/REPLACE/REMOVE PACEMAKER  01/24/2009     No Known Allergies  Medications:  Scheduled: . aspirin EC  81 mg Oral Daily  .  ceFAZolin (ANCEF) IV  1 g Intravenous Q8H  . senna  1 tablet Oral BID  . sodium chloride  1,000 mL Intravenous Once  . sodium  chloride flush  3 mL Intravenous Q12H    Abtx:  Anti-infectives    Start     Dose/Rate Route Frequency Ordered Stop   03/24/16 0430  ceFAZolin (ANCEF) IVPB 1 g/50 mL premix     1 g 100 mL/hr over 30 Minutes Intravenous Every 8 hours 03/23/16 2147 03/24/16 2029      Total days of antibiotics: 0 ancef          Social History:  reports that he has never smoked. He has never used smokeless tobacco. He reports that he drinks alcohol. He reports that he does not use drugs.  Family History  Problem Relation Age of Onset  . Sudden death Father   . Diabetes type II Sister     General ROS: normal BM, normal urination, see HPI  Blood pressure 132/85, pulse (!) 108, temperature 98.4 F (36.9 C), temperature source Oral, resp. rate 18, SpO2 98 %. General appearance: alert, cooperative and no distress Eyes: negative findings: conjunctivae and sclerae normal and pupils equal, round, reactive to light and accomodation Throat: lips, mucosa, and tongue normal; teeth and gums normal Neck: swoolen, wound dressed, drain in place.  Lungs: clear to auscultation bilaterally Heart: regular rate and rhythm and L upper chest pacer is non-tender, non-fluctuant.  Abdomen: normal findings: bowel sounds normal and soft, non-tender Extremities: edema none   Results for orders placed or performed during the hospital encounter of 03/23/16 (  from the past 48 hour(s))  Culture, blood (Routine x 2)     Status: None (Preliminary result)   Collection Time: 03/23/16  5:49 PM  Result Value Ref Range   Specimen Description BLOOD RIGHT ANTECUBITAL    Special Requests BOTTLES DRAWN AEROBIC AND ANAEROBIC 10CC    Culture  Setup Time      GRAM POSITIVE COCCI IN CLUSTERS IN BOTH AEROBIC AND ANAEROBIC BOTTLES Organism ID to follow CRITICAL RESULT CALLED TO, READ BACK BY AND VERIFIED WITH: AElta Gu[place].D. 10:55 03/24/16  ([email or web address])    Culture GRAM POSITIVE COCCI    Report Status PENDING   Blood Culture ID Panel  (Reflexed)     Status: Abnormal   Collection Time: 03/23/16  5:49 PM  Result Value Ref Range   Enterococcus species NOT DETECTED NOT DETECTED   Listeria monocytogenes NOT DETECTED NOT DETECTED   Staphylococcus species DETECTED (A) NOT DETECTED    Comment: CRITICAL RESULT CALLED TO, READ BACK BY AND VERIFIED WITH: AElta [place].D. 10:55 03/24/16 ([email or web address])    Staphylococcus aureus DETECTED (A) NOT DETECTED    Comment: CRITICAL RESULT CALLED TO, READ BACK BY AND VERIFIED WITH: AElta [place].D. 10:55 03/24/16 ([email or web address])    Methicillin resistance NOT DETECTED NOT DETECTED   Streptococcus species NOT DETECTED NOT DETECTED   Streptococcus agalactiae NOT DETECTED NOT DETECTED   Streptococcus pneumoniae NOT DETECTED NOT DETECTED   Streptococcus pyogenes NOT DETECTED NOT DETECTED   Acinetobacter baumannii NOT DETECTED NOT DETECTED   Enterobacteriaceae species NOT DETECTED NOT DETECTED   Enterobacter cloacae complex NOT DETECTED NOT DETECTED   Escherichia coli NOT DETECTED NOT DETECTED   Klebsiella oxytoca NOT DETECTED NOT DETECTED   Klebsiella pneumoniae NOT DETECTED NOT DETECTED   Proteus species NOT DETECTED NOT DETECTED   Serratia marcescens NOT DETECTED NOT DETECTED   Haemophilus influenzae NOT DETECTED NOT DETECTED   Neisseria meningitidis NOT DETECTED NOT DETECTED   Pseudomonas aeruginosa NOT DETECTED NOT DETECTED   Candida albicans NOT DETECTED NOT DETECTED   Candida glabrata NOT DETECTED NOT DETECTED   Candida krusei NOT DETECTED NOT DETECTED   Candida parapsilosis NOT DETECTED NOT DETECTED   Candida tropicalis NOT DETECTED NOT DETECTED  Comprehensive metabolic panel     Status: Abnormal   Collection Time: 03/23/16  5:55 PM  Result Value Ref Range   Sodium 140 135 - 145 mmol/L   Potassium 4.1 3.5 - 5.1 mmol/L   Chloride 101 101 - 111 mmol/L   CO2 28 22 - 32 mmol/L   Glucose, Bld 111 (H) 65 - 99 mg/dL   BUN 9 6 - 20 mg/dL   Creatinine, Ser 1.14 0.61 - 1.24 mg/dL    Calcium 10.0 8.9 - 10.3 mg/dL   Total Protein 8.0 6.5 - 8.1 g/dL   Albumin 4.5 3.5 - 5.0 g/dL   AST 31 15 - 41 U/L   ALT 57 17 - 63 U/L   Alkaline Phosphatase 97 38 - 126 U/L   Total Bilirubin 1.6 (H) 0.3 - 1.2 mg/dL   GFR calc non Af Amer >60 >60 mL/min   GFR calc Af Amer >60 >60 mL/min    Comment: (NOTE) The eGFR has been calculated using the CKD EPI equation. This calculation has not been validated in all clinical situations. eGFR's persistently <60 mL/min signify possible Chronic Kidney Disease.    Anion gap 11 5 - 15  CBC with Differential     Status: Abnormal   Collection Time: 03/23/16  5:55 PM  Result Value Ref Range   WBC 22.7 (H) 4.0 - 10.5 K/uL   RBC 5.61 4.22 - 5.81 MIL/uL   Hemoglobin 16.7 13.0 - 17.0 g/dL   HCT 48.8 39.0 - 52.0 %   MCV 87.0 78.0 - 100.0 fL   MCH 29.8 26.0 - 34.0 pg   MCHC 34.2 30.0 - 36.0 g/dL   RDW 13.4 11.5 - 15.5 %   Platelets 202 150 - 400 K/uL   Neutrophils Relative % 83 %   Neutro Abs 18.8 (H) 1.7 - 7.7 K/uL   Lymphocytes Relative 9 %   Lymphs Abs 2.0 0.7 - 4.0 K/uL   Monocytes Relative 6 %   Monocytes Absolute 1.4 (H) 0.1 - 1.0 K/uL   Eosinophils Relative 2 %   Eosinophils Absolute 0.3 0.0 - 0.7 K/uL   Basophils Relative 0 %   Basophils Absolute 0.1 0.0 - 0.1 K/uL  Protime-INR     Status: None   Collection Time: 03/23/16  5:55 PM  Result Value Ref Range   Prothrombin Time 14.0 11.4 - 15.2 seconds   INR 1.08   Urinalysis, Routine w reflex microscopic     Status: Abnormal   Collection Time: 03/23/16  6:27 PM  Result Value Ref Range   Color, Urine YELLOW YELLOW   APPearance CLEAR CLEAR   Specific Gravity, Urine 1.013 1.005 - 1.030   pH 6.0 5.0 - 8.0   Glucose, UA NEGATIVE NEGATIVE mg/dL   Hgb urine dipstick NEGATIVE NEGATIVE   Bilirubin Urine NEGATIVE NEGATIVE   Ketones, ur 5 (A) NEGATIVE mg/dL   Protein, ur NEGATIVE NEGATIVE mg/dL   Nitrite NEGATIVE NEGATIVE   Leukocytes, UA NEGATIVE NEGATIVE  I-Stat CG4 Lactic Acid, ED      Status: None   Collection Time: 03/23/16  6:43 PM  Result Value Ref Range   Lactic Acid, Venous 1.73 0.5 - 1.9 mmol/L  Aerobic/Anaerobic Culture (surgical/deep wound)     Status: None (Preliminary result)   Collection Time: 03/23/16  9:08 PM  Result Value Ref Range   Specimen Description WOUND    Special Requests CERVICAL NECK    Gram Stain      ABUNDANT WBC PRESENT, PREDOMINANTLY PMN MODERATE GRAM POSITIVE COCCI IN PAIRS Gram Stain Report Called to,Read Back By and Verified With: B BASS RN 2236 03/23/16 A BROWNING    Culture CULTURE REINCUBATED FOR BETTER GROWTH    Report Status PENDING   MRSA PCR Screening     Status: None   Collection Time: 03/23/16 10:24 PM  Result Value Ref Range   MRSA by PCR NEGATIVE NEGATIVE    Comment:        The GeneXpert MRSA Assay (FDA approved for NASAL specimens only), is one component of a comprehensive MRSA colonization surveillance program. It is not intended to diagnose MRSA infection nor to guide or monitor treatment for MRSA infections.       Component Value Date/Time   SDES WOUND 03/23/2016 2108   [place] CERVICAL NECK 03/23/2016 2108   CULT CULTURE REINCUBATED FOR BETTER GROWTH 03/23/2016 2108   REPTSTATUS PENDING 03/23/2016 2108   Dg Chest 2 View  Result Date: 03/23/2016 CLINICAL DATA:  60 year old male with history of fever. Large hematoma on the anterior aspect of the neck, with difficulty swallowing for the past 2 days. Status post ACDF surgery 12 days ago. EXAM: CHEST  2 VIEW COMPARISON:  Chest x-ray 01/25/2009. FINDINGS: Left-sided pacemaker device in place with lead tips projecting over the expected location of the  right atrium and right ventricular apex. Lung volumes are normal. No consolidative airspace disease. No pleural effusions. No pneumothorax. No pulmonary nodule or mass noted. Pulmonary vasculature and the cardiomediastinal silhouette are within normal limits. Postoperative changes of cervical spinal fixation are  noted in the lower cervical spine. IMPRESSION: No radiographic evidence of acute cardiopulmonary disease. Electronically Signed   By: Vinnie Langton M.D.   On: 03/23/2016 19:07   Ct Soft Tissue Neck Wo Contrast  Result Date: 03/23/2016 CLINICAL DATA:  Postoperative swelling. EXAM: CT NECK WITHOUT CONTRAST TECHNIQUE: Multidetector CT imaging of the neck was performed following the standard protocol without intravenous contrast. COMPARISON:  CT myelogram 02/06/2016 FINDINGS: Pharynx and larynx: Fluid density with areas of gas and peripheral punctate high density extending from the retropharynx right lateral around the central compartment ot the skin surface in the ventral right neck. There is likely submucosal edema on the posterior pharyngeal wall. The pharynx and larynx is displaced anteriorly and to the left and moderately narrowed. Inflammation tracks along the esophagus at the thoracic inlet. Due to irregular size, the collection is difficult to quantify. The subcutaneous portion alone measures up to 6 cm in diameter. Salivary glands: Negative Thyroid: Negative Lymph nodes: No notable enlargement. Vascular: Negative Limited intracranial: Negative Visualized orbits: Non encompassed. Mastoids and visualized paranasal sinuses: Negative where seen Skeleton: C4-5, C5-6, C6-7 ACDF with ventral plate and screw. The hardware is intact and there is no acute fracture. Upper chest: No inflammation beyond the thoracic inlet. Clear apical lungs. Case discussed between Dr. Jola Baptist and Dr. Joya Salm in person at time of imaging. IMPRESSION: Large surgical bed fluid collection with pharyngeal and laryngeal mass effect and narrowing. Postoperative abscess, hematoma/seroma, or CSF leak could have this appearance. If an abscess, would consider esophageal injury. C4-5, C5-6, and C6-7 ACDF hardware is unremarkable. Electronically Signed   By: Monte Fantasia M.D.   On: 03/23/2016 18:59   Recent Results (from the past 240  hour(s))  Culture, blood (Routine x 2)     Status: None (Preliminary result)   Collection Time: 03/23/16  5:49 PM  Result Value Ref Range Status   Specimen Description BLOOD RIGHT ANTECUBITAL  Final   Special Requests BOTTLES DRAWN AEROBIC AND ANAEROBIC 10CC  Final   Culture  Setup Time   Final    GRAM POSITIVE COCCI IN CLUSTERS IN BOTH AEROBIC AND ANAEROBIC BOTTLES Organism ID to follow CRITICAL RESULT CALLED TO, READ BACK BY AND VERIFIED WITH: AElta [place].D. 10:55 03/24/16  ([email or web address])    Culture GRAM POSITIVE COCCI  Final   Report Status PENDING  Incomplete  Blood Culture ID Panel (Reflexed)     Status: Abnormal   Collection Time: 03/23/16  5:49 PM  Result Value Ref Range Status   Enterococcus species NOT DETECTED NOT DETECTED Final   Listeria monocytogenes NOT DETECTED NOT DETECTED Final   Staphylococcus species DETECTED (A) NOT DETECTED Final    Comment: CRITICAL RESULT CALLED TO, READ BACK BY AND VERIFIED WITH: AElta [place].D. 10:55 03/24/16 ([email or web address])    Staphylococcus aureus DETECTED (A) NOT DETECTED Final    Comment: CRITICAL RESULT CALLED TO, READ BACK BY AND VERIFIED WITH: AElta [place].D. 10:55 03/24/16 ([email or web address])    Methicillin resistance NOT DETECTED NOT DETECTED Final   Streptococcus species NOT DETECTED NOT DETECTED Final   Streptococcus agalactiae NOT DETECTED NOT DETECTED Final   Streptococcus pneumoniae NOT DETECTED NOT DETECTED Final   Streptococcus pyogenes NOT DETECTED NOT DETECTED Final   Acinetobacter baumannii NOT  DETECTED NOT DETECTED Final   Enterobacteriaceae species NOT DETECTED NOT DETECTED Final   Enterobacter cloacae complex NOT DETECTED NOT DETECTED Final   Escherichia coli NOT DETECTED NOT DETECTED Final   Klebsiella oxytoca NOT DETECTED NOT DETECTED Final   Klebsiella pneumoniae NOT DETECTED NOT DETECTED Final   Proteus species NOT DETECTED NOT DETECTED Final   Serratia marcescens NOT DETECTED NOT DETECTED Final   Haemophilus  influenzae NOT DETECTED NOT DETECTED Final   Neisseria meningitidis NOT DETECTED NOT DETECTED Final   Pseudomonas aeruginosa NOT DETECTED NOT DETECTED Final   Candida albicans NOT DETECTED NOT DETECTED Final   Candida glabrata NOT DETECTED NOT DETECTED Final   Candida krusei NOT DETECTED NOT DETECTED Final   Candida parapsilosis NOT DETECTED NOT DETECTED Final   Candida tropicalis NOT DETECTED NOT DETECTED Final  Aerobic/Anaerobic Culture (surgical/deep wound)     Status: None (Preliminary result)   Collection Time: 03/23/16  9:08 PM  Result Value Ref Range Status   Specimen Description WOUND  Final   Special Requests CERVICAL NECK  Final   Gram Stain   Final    ABUNDANT WBC PRESENT, PREDOMINANTLY PMN MODERATE GRAM POSITIVE COCCI IN PAIRS Gram Stain Report Called to,Read Back By and Verified With: B BASS RN 2236 03/23/16 A BROWNING    Culture CULTURE REINCUBATED FOR BETTER GROWTH  Final   Report Status PENDING  Incomplete  MRSA PCR Screening     Status: None   Collection Time: 03/23/16 10:24 PM  Result Value Ref Range Status   MRSA by PCR NEGATIVE NEGATIVE Final    Comment:        The GeneXpert MRSA Assay (FDA approved for NASAL specimens only), is one component of a comprehensive MRSA colonization surveillance program. It is not intended to diagnose MRSA infection nor to guide or monitor treatment for MRSA infections.       03/24/2016, 10:59 AM     LOS: 1 day    Records and images were personally reviewed where available.

## 2016-03-24 NOTE — Progress Notes (Signed)
[facility] will follow pt with ID team for long term IV ABX to assist as needed upon DC.  If patient discharges after hours, please call [phone].   Bob Schmidt 03/24/2016, 2:22 PM

## 2016-03-24 NOTE — Progress Notes (Signed)
Patient ID: Bob Chiquito, MD, male   DOB: 08-04-1955, 60 y.o.   MRN: [id number] Doing well, voice better, seen by ID . Drain working well. For barium swallow today if possible

## 2016-03-24 NOTE — Significant Event (Signed)
Patient taken to radiology for barium swallow studies with RN, then transferred safely to 5N12 from there, traveled via wheelchair. VS during the procedure and throughout the transportation. All personal belongings with patient (cellphone). Patient's spouse remain with patient all day today. Report given to receiving RN. Patient settled in chair. Family at bedside.    Ronith Berti

## 2016-03-24 NOTE — Op Note (Signed)
NAME:  Bob Schmidt, Bob Schmidt                     ACCOUNT NO.:  MEDICAL RECORD NO.:  [id number]  LOCATION:                                 FACILITY:  PHYSICIAN:  Leeroy Cha, M.D.        DATE OF BIRTH:  DATE OF PROCEDURE:  03/23/2016 DATE OF DISCHARGE:                              OPERATIVE REPORT   PREOPERATIVE DIAGNOSIS:  Exploration of anterior cervical wound.  Rule out infection, CSF leak or hematoma.  POSTOPERATIVE DIAGNOSIS:  Cervical wound infection.  PROCEDURE:  I and D of cervical wound.  Rigid esophagoscopy by the ENT, Dr. Constance Holster.  CLINICAL HISTORY:  [name] is a gentleman who 12 days ago underwent anterior cervical diskectomy and fusion as an outpatient procedure.  The patient did well, but he has noticed some difficulty swallowing, which is getting worse lately.  He called me today telling me that he spiked temperature up to 102 and his voice was quite hoarse by telephone.  I brought him immediately to the emergency room.  A CT scan was done as a stat, which showed large collection of fluid in the anterior cervical area with displacement of the trachea, larynx and esophagus to rule out hematoma, CSF leak, or infection.  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  DESCRIPTION OF PROCEDURE:  The patient was taken to the OR and after intubation, the right side of the neck was cleaned with DuraPrep. Drapes were applied.  Incision following the previous one was made. Immediately, a foul purulent material came.  Specimens were taken and sent to the laboratory for cultures.  Then, the dissection was carried out all the way down to the area where he had the plate.  Using large amount of saline solution, we irrigated the area.  I investigated the esophagus, but there was evidence of any perforation.  Nevertheless, I was concerned about the possibility of this might being tear of the esophagus and I called Dr. Constance Holster who is the ENT  on-call.  He can perform a rigid esophagoscopy, which was negative.  From then on, the area was irrigated, the wound was closed leaving behind a drain.  For closure, we used Vicryl and Steri-Strip.  The patient is going to be extubated and we will continue to follow him.  We will get Infectious Disease consult in the morning.          ______________________________ Leeroy Cha, M.D.     EB/MEDQ  D:  03/23/2016  T:  03/24/2016  Job:  [id number]

## 2016-03-24 NOTE — Progress Notes (Signed)
Paged Botero MD with results of patient's Gram Stain Wound Culture. Will continue to monitor the patient closely.

## 2016-03-24 NOTE — Progress Notes (Signed)
PT Cancellation Note/Screen  Patient Details Name: Bob FAWCETT, MD MRN: [id number] DOB: 1955-08-09   Cancelled Treatment:    Reason Eval/Treat Not Completed: PT screened, no needs identified, will sign off.  Pt is moving well around the room.  He is getting some return in sensation in bil arms, his normal level of weakness in right deltoid, and some hypersensitivity in C8 dermatome bil.  PT to sign off as acute therapy is not needed.   Thanks,    Barbarann Ehlers. Kalaysia Demonbreun, PT, DPT [phone]   03/24/2016, 3:23 PM

## 2016-03-25 DIAGNOSIS — B9561 Methicillin susceptible Staphylococcus aureus infection as the cause of diseases classified elsewhere: Secondary | ICD-10-CM

## 2016-03-25 DIAGNOSIS — L0291 Cutaneous abscess, unspecified: Secondary | ICD-10-CM

## 2016-03-25 DIAGNOSIS — R7881 Bacteremia: Secondary | ICD-10-CM

## 2016-03-25 MED ORDER — GABAPENTIN 300 MG PO CAPS
300.0000 mg | ORAL_CAPSULE | Freq: Three times a day (TID) | ORAL | Status: DC
  Administered 2016-03-25 (×2): 300 mg via ORAL
  Filled 2016-03-25 (×2): qty 1

## 2016-03-25 NOTE — Anesthesia Postprocedure Evaluation (Signed)
Anesthesia Post Note  Patient: Bob Chiquito, MD  Procedure(s) Performed: Procedure(s) (LRB): Incision and Drainage of Cervical Wound (N/A) ESOPHAGOSCOPY (N/A)  Patient location during evaluation: PACU Anesthesia Type: General Level of consciousness: awake and alert Pain management: pain level controlled Vital Signs Assessment: post-procedure vital signs reviewed and stable Respiratory status: spontaneous breathing, nonlabored ventilation, respiratory function stable and patient connected to nasal cannula oxygen Cardiovascular status: blood pressure returned to baseline and stable Postop Assessment: no signs of nausea or vomiting Anesthetic complications: no       Last Vitals:  Vitals:   03/24/16 2112 03/25/16 0545  BP: 120/65 113/61  Pulse: 93 75  Resp: 19 19  Temp: 37.3 C 36.5 C    Last Pain:  Vitals:   03/25/16 0545  TempSrc: Oral  PainSc:                  Joie Reamer S

## 2016-03-25 NOTE — Progress Notes (Signed)
OT Cancellation Note/Screen  Patient Details Name: Bob WERLING, MD MRN: [id number] DOB: 1955-09-21   Cancelled Treatment:    Reason Eval/Treat Not Completed: OT screened, no needs identified, will sign off. Pt was very pleasant, alert and oriented. Pt presented with deficits in RUE (only able to go 0-90 abduction AROM), but improving since yesterday since he could only perform gravity eliminated. Pt also states lancinating pain on LUE on forearm that is relieved with repositioning. Pt also shared that since his surgery on the 12th he has had trouble buttoning buttons. Pt feels confident that his symptoms will improve as swelling goes down, and that there is no need for OT at this time. Thank you for the referral.  Jaci Carrel 03/25/2016, 9:09 AM  Hulda Humphrey OTR/L [phone]

## 2016-03-25 NOTE — Progress Notes (Signed)
Patient ID: Bob Chiquito, MD, male   DOB: [birth year], 60 y.o.   MRN: [id number] Doing great, still draining. Barium swallow was negative. C/o burning pain left arm. To start gabapentin PIIC line pending

## 2016-03-25 NOTE — Progress Notes (Signed)
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  Active Problems:   Hematoma of neck   Wound infection after surgery  Subjective: Without complaints  Abtx:  Anti-infectives    Start     Dose/Rate Route Frequency Ordered Stop   03/24/16 1245  ceFAZolin (ANCEF) IVPB 2g/100 mL premix     2 g 200 mL/hr over 30 Minutes Intravenous Every 8 hours 03/24/16 1138     03/24/16 1245  rifampin (RIFADIN) capsule 300 mg     300 mg Oral Every 12 hours 03/24/16 1138     03/24/16 0430  ceFAZolin (ANCEF) IVPB 1 g/50 mL premix  Status:  Discontinued     1 g 100 mL/hr over 30 Minutes Intravenous Every 8 hours 03/23/16 2147 03/24/16 1138      Medications:  Scheduled: . aspirin EC  81 mg Oral Daily  .  ceFAZolin (ANCEF) IV  2 g Intravenous Q8H  . gabapentin  300 mg Oral TID  . rifampin  300 mg Oral Q12H  . senna  1 tablet Oral BID  . sodium chloride  1,000 mL Intravenous Once  . sodium chloride flush  3 mL Intravenous Q12H    Objective: Vital signs in last 24 hours: Temp:  [97.7 F (36.5 C)-99.1 F (37.3 C)] 97.7 F (36.5 C) (12/28 0545) Pulse Rate:  [75-93] 75 (12/28 0545) Resp:  [19-20] 19 (12/28 0545) BP: (113-134)/(61-75) 113/61 (12/28 0545) SpO2:  [97 %-100 %] 97 % (12/28 0545)   General appearance: alert, cooperative and no distress Neck: wound, dressing clean.   Lab Results  Recent Labs  03/23/16 1755  WBC 22.7*  HGB 16.7  HCT 48.8  NA 140  K 4.1  CL 101  CO2 28  BUN 9  CREATININE 1.14   Liver Panel  Recent Labs  03/23/16 1755 03/24/16 1459  PROT 8.0 7.2  ALBUMIN 4.5 3.6  AST 31 51*  ALT 57 71*  ALKPHOS 97 92  BILITOT 1.6* 1.1  BILIDIR  --  0.3  IBILI  --  0.8   Sedimentation Rate  Recent Labs  03/24/16 1459  ESRSEDRATE 41*   C-Reactive Protein  Recent Labs  03/24/16 1459  CRP 13.4*    Microbiology: Recent Results (from the past 240 hour(s))  Culture, blood (Routine x 2)     Status: Abnormal  (Preliminary result)   Collection Time: 03/23/16  5:49 PM  Result Value Ref Range Status   Specimen Description BLOOD RIGHT ANTECUBITAL  Final   Special Requests BOTTLES DRAWN AEROBIC AND ANAEROBIC 10CC  Final   Culture  Setup Time   Final    GRAM POSITIVE COCCI IN CLUSTERS IN BOTH AEROBIC AND ANAEROBIC BOTTLES Organism ID to follow CRITICAL RESULT CALLED TO, READ BACK BY AND VERIFIED WITH: AElta Gu[place].D. 10:55 03/24/16  ([email or web address])    Culture STAPHYLOCOCCUS AUREUS (A)  Final   Report Status PENDING  Incomplete  Blood Culture ID Panel (Reflexed)     Status: Abnormal   Collection Time: 03/23/16  5:49 PM  Result Value Ref Range Status   Enterococcus species NOT DETECTED NOT DETECTED Final   Listeria monocytogenes NOT DETECTED NOT DETECTED Final   Staphylococcus species DETECTED (A) NOT DETECTED Final    Comment: CRITICAL RESULT CALLED TO, READ BACK BY AND VERIFIED WITH: AElta [place].D. 10:55 03/24/16 ([email or web address])    Staphylococcus aureus DETECTED (A) NOT DETECTED Final    Comment: CRITICAL RESULT CALLED TO, READ  BACK BY AND VERIFIED WITH: AElta [place].D. 10:55 03/24/16 ([email or web address])    Methicillin resistance NOT DETECTED NOT DETECTED Final   Streptococcus species NOT DETECTED NOT DETECTED Final   Streptococcus agalactiae NOT DETECTED NOT DETECTED Final   Streptococcus pneumoniae NOT DETECTED NOT DETECTED Final   Streptococcus pyogenes NOT DETECTED NOT DETECTED Final   Acinetobacter baumannii NOT DETECTED NOT DETECTED Final   Enterobacteriaceae species NOT DETECTED NOT DETECTED Final   Enterobacter cloacae complex NOT DETECTED NOT DETECTED Final   Escherichia coli NOT DETECTED NOT DETECTED Final   Klebsiella oxytoca NOT DETECTED NOT DETECTED Final   Klebsiella pneumoniae NOT DETECTED NOT DETECTED Final   Proteus species NOT DETECTED NOT DETECTED Final   Serratia marcescens NOT DETECTED NOT DETECTED Final   Haemophilus influenzae NOT DETECTED NOT DETECTED Final   Neisseria  meningitidis NOT DETECTED NOT DETECTED Final   Pseudomonas aeruginosa NOT DETECTED NOT DETECTED Final   Candida albicans NOT DETECTED NOT DETECTED Final   Candida glabrata NOT DETECTED NOT DETECTED Final   Candida krusei NOT DETECTED NOT DETECTED Final   Candida parapsilosis NOT DETECTED NOT DETECTED Final   Candida tropicalis NOT DETECTED NOT DETECTED Final  Urine culture     Status: None   Collection Time: 03/23/16  6:27 PM  Result Value Ref Range Status   Specimen Description URINE, RANDOM  Final   Special Requests NONE  Final   Culture NO GROWTH  Final   Report Status 03/24/2016 FINAL  Final  Aerobic/Anaerobic Culture (surgical/deep wound)     Status: None (Preliminary result)   Collection Time: 03/23/16  9:08 PM  Result Value Ref Range Status   Specimen Description WOUND  Final   Special Requests CERVICAL NECK  Final   Gram Stain   Final    ABUNDANT WBC PRESENT, PREDOMINANTLY PMN MODERATE GRAM POSITIVE COCCI IN PAIRS Gram Stain Report Called to,Read Back By and Verified With: B BASS RN [id number] 03/23/16 A BROWNING    Culture CULTURE REINCUBATED FOR BETTER GROWTH  Final   Report Status PENDING  Incomplete  Culture, blood (Routine x 2)     Status: None (Preliminary result)   Collection Time: 03/23/16 10:24 PM  Result Value Ref Range Status   Specimen Description BLOOD LEFT HAND  Final   Special Requests BOTTLES DRAWN AEROBIC AND ANAEROBIC 5CC  Final   Culture NO GROWTH 1 DAY  Final   Report Status PENDING  Incomplete  MRSA PCR Screening     Status: None   Collection Time: 03/23/16 10:24 PM  Result Value Ref Range Status   MRSA by PCR NEGATIVE NEGATIVE Final    Comment:        The GeneXpert MRSA Assay (FDA approved for NASAL specimens only), is one component of a comprehensive MRSA colonization surveillance program. It is not intended to diagnose MRSA infection nor to guide or monitor treatment for MRSA infections.   Culture, blood (Routine X 2) w Reflex to ID Panel      Status: None (Preliminary result)   Collection Time: 03/24/16  2:59 PM  Result Value Ref Range Status   Specimen Description BLOOD RIGHT ANTECUBITAL  Final   Special Requests IN PEDIATRIC BOTTLE 2CC  Final   Culture NO GROWTH < 24 HOURS  Final   Report Status PENDING  Incomplete    Studies/Results: Dg Chest 2 View  Result Date: 03/23/2016 CLINICAL DATA:  60 year old male with history of fever. Large hematoma on the anterior aspect of the neck, with difficulty  swallowing for the past 2 days. Status post ACDF surgery 12 days ago. EXAM: CHEST  2 VIEW COMPARISON:  Chest x-ray 01/25/2009. FINDINGS: Left-sided pacemaker device in place with lead tips projecting over the expected location of the right atrium and right ventricular apex. Lung volumes are normal. No consolidative airspace disease. No pleural effusions. No pneumothorax. No pulmonary nodule or mass noted. Pulmonary vasculature and the cardiomediastinal silhouette are within normal limits. Postoperative changes of cervical spinal fixation are noted in the lower cervical spine. IMPRESSION: No radiographic evidence of acute cardiopulmonary disease. Electronically Signed   By: Vinnie Langton M.D.   On: 03/23/2016 19:07   Ct Soft Tissue Neck Wo Contrast  Result Date: 03/23/2016 CLINICAL DATA:  Postoperative swelling. EXAM: CT NECK WITHOUT CONTRAST TECHNIQUE: Multidetector CT imaging of the neck was performed following the standard protocol without intravenous contrast. COMPARISON:  CT myelogram 02/06/2016 FINDINGS: Pharynx and larynx: Fluid density with areas of gas and peripheral punctate high density extending from the retropharynx right lateral around the central compartment ot the skin surface in the ventral right neck. There is likely submucosal edema on the posterior pharyngeal wall. The pharynx and larynx is displaced anteriorly and to the left and moderately narrowed. Inflammation tracks along the esophagus at the thoracic inlet. Due to  irregular size, the collection is difficult to quantify. The subcutaneous portion alone measures up to 6 cm in diameter. Salivary glands: Negative Thyroid: Negative Lymph nodes: No notable enlargement. Vascular: Negative Limited intracranial: Negative Visualized orbits: Non encompassed. Mastoids and visualized paranasal sinuses: Negative where seen Skeleton: C4-5, C5-6, C6-7 ACDF with ventral plate and screw. The hardware is intact and there is no acute fracture. Upper chest: No inflammation beyond the thoracic inlet. Clear apical lungs. Case discussed between Dr. Jola Baptist and Dr. Joya Salm in person at time of imaging. IMPRESSION: Large surgical bed fluid collection with pharyngeal and laryngeal mass effect and narrowing. Postoperative abscess, hematoma/seroma, or CSF leak could have this appearance. If an abscess, would consider esophageal injury. C4-5, C5-6, and C6-7 ACDF hardware is unremarkable. Electronically Signed   By: Monte Fantasia M.D.   On: 03/23/2016 18:59   Dg Esophagus  Result Date: 03/24/2016 CLINICAL DATA:  Recent 3 level cervical fusion with subsequent abscess drainage. Evaluate esophagus for leak. EXAM: ESOPHOGRAM/BARIUM SWALLOW TECHNIQUE: Single contrast examination was performed using  [id number]. FLUOROSCOPY TIME:  Fluoroscopy Time:  0 minutes and 35 seconds Radiation Exposure Index (if provided by the fluoroscopic device): 9.7 mGy Number of Acquired Spot Images: 0 COMPARISON:  CT myelogram 02/06/2016 FINDINGS: Initial swallows in the lateral projection demonstrate normal pharyngeal motion with swallowing. No laryngeal penetration or aspiration. No esophageal leak is identified. There is a drainage catheter noted along the ventral margin of the plate and screws. Frontal swallow also demonstrates no abnormality. IMPRESSION: Normal esophagram.  No esophageal abnormality or leaking contrast. Electronically Signed   By: Marijo Sanes M.D.   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  His repeat BCx are ngtd Abscess Cx pending.  Drain out 15 cc yesterday He needs TEE.  Place PIC when repeat BCx negative at least 48h Defer to surgery/IR on when to pull drain.  Multiple questions from wife and pt, attempted to answer.           Bobby Rumpf Infectious Diseases (pager) [phone] [email or web address] 03/25/2016, 1:19 PM  LOS: 2 days

## 2016-03-26 LAB — CULTURE, BLOOD (ROUTINE X 2)

## 2016-03-26 MED ORDER — GABAPENTIN 100 MG PO CAPS: 100.0000 mg | ORAL_CAPSULE | Freq: Three times a day (TID) | ORAL | Status: DC

## 2016-03-26 MED ORDER — GABAPENTIN 100 MG PO CAPS
100.0000 mg | ORAL_CAPSULE | Freq: Three times a day (TID) | ORAL | Status: DC
  Administered 2016-03-26 – 2016-03-30 (×12): 100 mg via ORAL
  Filled 2016-03-26 (×12): qty 1

## 2016-03-26 NOTE — Progress Notes (Signed)
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 With recent C4-5, C5-6, and C6-7 ACDF hardware placement on 12/14. POD #3 from I x D, exploration of anterior cervical wound. unremarkable. Active Problems:   Hematoma of neck   Wound infection after surgery   Abscess   Staphylococcus aureus bacteremia    Subjective: Afebrile. occ chills. Still has drain in place with serous drainage  Medications:  . aspirin EC  81 mg Oral Daily  .  ceFAZolin (ANCEF) IV  2 g Intravenous Q8H  . gabapentin  100 mg Oral TID  . rifampin  300 mg Oral Q12H  . senna  1 tablet Oral BID  . sodium chloride  1,000 mL Intravenous Once  . sodium chloride flush  3 mL Intravenous Q12H    Objective: Vital signs in last 24 hours: Temp:  [97.5 F (36.4 C)-98.5 F (36.9 C)] 97.5 F (36.4 C) (12/29 1407) Pulse Rate:  [70-86] 86 (12/29 1407) Resp:  [17-18] 18 (12/29 1407) BP: (130-145)/(75-86) 134/75 (12/29 1407) SpO2:  [96 %-98 %] 97 % (12/29 1407) Physical Exam  Constitutional: He is oriented to person, place, and time. He appears well-developed and well-nourished. No distress.  HENT:  Mouth/Throat: Oropharynx is clear and moist. No oropharyngeal exudate.  Neck: bandage in place. Drain shows serous fluid in bulb Cardiovascular: Normal rate, regular rhythm and normal heart sounds. Exam reveals no gallop and no friction rub.  No murmur heard.  Pulmonary/Chest: Effort normal and breath sounds normal. No respiratory distress. He has no wheezes.  Abdominal: Soft. Bowel sounds are normal. He exhibits no distension. There is no tenderness.  Lymphadenopathy:  He has no cervical adenopathy.  Neurological: He is alert and oriented to person, place,  and time.  Skin: Skin is warm and dry. No rash noted. No erythema.  Psychiatric: He has a normal mood and affect. His behavior is normal.     Lab Results  Recent Labs  03/23/16 1755  WBC 22.7*  HGB 16.7  HCT 48.8  NA 140  K 4.1  CL 101  CO2 28  BUN 9  CREATININE 1.14   Liver Panel  Recent Labs  03/23/16 1755 03/24/16 1459  PROT 8.0 7.2  ALBUMIN 4.5 3.6  AST 31 51*  ALT 57 71*  ALKPHOS 97 92  BILITOT 1.6* 1.1  BILIDIR  --  0.3  IBILI  --  0.8   Sedimentation Rate  Recent Labs  03/24/16 1459  ESRSEDRATE 41*   C-Reactive Protein  Recent Labs  03/24/16 1459  CRP 13.4*    Microbiology: 12/26 or cx MSSA 12/26 blood cx in 2 of 3 sets MSSA 12/27 blood cx x 1 set NGTD  Studies/Results: No results found.   Assessment/Plan: MSSA post surgical site infection/ cervical wound infection 2/2 cervical discketomy and fusion c/b MSSA bacteremia =  - continue with cefazolin 2gm IV Q 8hr plus rifampin 600mg  daily - repeat blood cx are no growth to date - will need to get TEE to exclude cardiac device infection. Can start with TTE. I have spoken to cards master  to expect to place patient on TEE schedule for Tuesday  - will likely need 6 wk of iv abtx.  - please wait on picc line til TEE is done to see if cardiac device is involved  Leukocytosis = will repeat cbc to see that leukocytosis is improving  transaminitis = will check hepatic panel tomorrow  Health maintenance = will check hiv and [facility] for Infectious Diseases Cell: [phone] Pager: [phone]  03/26/2016, 3:18 PM

## 2016-03-26 NOTE — Progress Notes (Signed)
Patient ID: Bob Chiquito, MD, male   DOB: 1955/11/07, 61 y.o.   MRN: [id number] No complains. Still some drainage. picc line pending. Pool to take over in am.

## 2016-03-27 LAB — COMPREHENSIVE METABOLIC PANEL
ALT: 41 U/L (ref 17–63)
AST: 22 U/L (ref 15–41)
Albumin: 3.2 g/dL — ABNORMAL LOW (ref 3.5–5.0)
Alkaline Phosphatase: 104 U/L (ref 38–126)
Anion gap: 10 (ref 5–15)
BUN: 11 mg/dL (ref 6–20)
CO2: 26 mmol/L (ref 22–32)
Calcium: 9.4 mg/dL (ref 8.9–10.3)
Chloride: 103 mmol/L (ref 101–111)
Creatinine, Ser: 0.94 mg/dL (ref 0.61–1.24)
GFR calc Af Amer: >60 mL/min (ref >60)
GFR calc non Af Amer: >60 mL/min (ref >60)
Glucose, Bld: 108 mg/dL — ABNORMAL HIGH (ref 65–99)
Potassium: 3.7 mmol/L (ref 3.5–5.1)
Sodium: 139 mmol/L (ref 135–145)
Total Bilirubin: 1 mg/dL (ref 0.3–1.2)
Total Protein: 6.9 g/dL (ref 6.5–8.1)

## 2016-03-27 LAB — CBC WITH DIFFERENTIAL/PLATELET
Basophils Absolute: 0.1 10*3/uL (ref 0.0–0.1)
Basophils Relative: 1 %
Eosinophils Absolute: 0.7 10*3/uL (ref 0.0–0.7)
Eosinophils Relative: 6 %
HCT: 42.4 % (ref 39.0–52.0)
Hemoglobin: 14.6 g/dL (ref 13.0–17.0)
Lymphocytes Relative: 26 %
Lymphs Abs: 2.8 10*3/uL (ref 0.7–4.0)
MCH: 29.4 pg (ref 26.0–34.0)
MCHC: 34.4 g/dL (ref 30.0–36.0)
MCV: 85.3 fL (ref 78.0–100.0)
Monocytes Absolute: 0.8 10*3/uL (ref 0.1–1.0)
Monocytes Relative: 8 %
Neutro Abs: 6.5 10*3/uL (ref 1.7–7.7)
Neutrophils Relative %: 59 %
Platelets: 242 10*3/uL (ref 150–400)
RBC: 4.97 MIL/uL (ref 4.22–5.81)
RDW: 13.1 % (ref 11.5–15.5)
WBC: 10.8 10*3/uL — ABNORMAL HIGH (ref 4.0–10.5)

## 2016-03-27 NOTE — Progress Notes (Signed)
Received a call from patient's spouse stating that he called her in tears stating that no one is leaving him alone to rest. Note placed on door stating to check with nurse before entering, and tech notified not to enter until at least 2pm. Will check on patient periodically without waking.

## 2016-03-27 NOTE — Progress Notes (Signed)
Please note previous assessment  , pt sleeping . No further assessment completed

## 2016-03-28 ENCOUNTER — Inpatient Hospital Stay (HOSPITAL_COMMUNITY): Payer: BC Managed Care – PPO

## 2016-03-28 DIAGNOSIS — R7881 Bacteremia: Secondary | ICD-10-CM

## 2016-03-28 LAB — ECHOCARDIOGRAM COMPLETE
E decel time: 288 msec
E/e' ratio: 3.47
FS: 33 % (ref 28–44)
IVS/LV PW RATIO, ED: 1.35
LA ID, A-P, ES: 38 mm
LA diam end sys: 38 mm
LA diam index: 1.62 cm/m2
LA vol A4C: 43.1 ml
LA vol index: 21.7 mL/m2
LA vol: 51 mL
LV E/e' medial: 3.47
LV E/e'average: 3.47
LV PW d: 11.5 mm — AB (ref 0.6–1.1)
LV e' LATERAL: 13.3 cm/s
LVOT area: 3.8 cm2
LVOT diameter: 22 mm
MV Dec: 288
MV pk A vel: 71.3 m/s
MV pk E vel: 46.1 m/s
Reg peak vel: 204 cm/s
TDI e' lateral: 13.3
TDI e' medial: 5.54
TR max vel: 204 cm/s

## 2016-03-28 LAB — AEROBIC/ANAEROBIC CULTURE (SURGICAL/DEEP WOUND)

## 2016-03-28 LAB — AEROBIC/ANAEROBIC CULTURE W GRAM STAIN (SURGICAL/DEEP WOUND)

## 2016-03-28 LAB — HIV ANTIBODY (ROUTINE TESTING W REFLEX): HIV Screen 4th Generation wRfx: NONREACTIVE

## 2016-03-28 LAB — HEPATITIS C ANTIBODY: HCV Ab: <0.1 s/co ratio (ref 0.0–0.9)

## 2016-03-28 NOTE — Progress Notes (Signed)
No issues overnight. Pt has no significant complaints this am. Swallowing well, no c/o dysphagia.  EXAM:  BP 124/82 (BP Location: Right Arm)   Pulse 74   Temp 98.6 F (37 C) (Oral)   Resp 18   SpO2 98%   Awake, alert, oriented  Speech fluent, appropriate  CN grossly intact  5/5 BUE/BLE   IMPRESSION:  60 y.o. male s/p debridement of cervical wound s/p ACDF.  PLAN: - Cont JP for today - Appreciate ID input. Planning on TEE Tuesday - Cont IV abx

## 2016-03-28 NOTE — Progress Notes (Signed)
  Echocardiogram 2D Echocardiogram has been performed.  Diamond Nickel 03/28/2016, 9:14 AM

## 2016-03-28 NOTE — Progress Notes (Signed)
Remains afebrile. No new positive cultures. TTE did not show any vegetation on pacer wire   Impression: 60yo male with MSSA post surgical site infection/cervical wound s/p debridement Plan:  Continue with cefazolin plus rifampin x 6 wk Plan for TEE on tuesday

## 2016-03-29 LAB — CULTURE, BLOOD (ROUTINE X 2)
Culture: NO GROWTH
Culture: NO GROWTH

## 2016-03-29 MED ORDER — MUPIROCIN 2 % EX OINT
1.0000 "application " | TOPICAL_OINTMENT | Freq: Two times a day (BID) | CUTANEOUS | Status: DC
  Filled 2016-03-29: qty 22

## 2016-03-29 MED ORDER — CHLORHEXIDINE GLUCONATE CLOTH 2 % EX PADS: 6.0000 | MEDICATED_PAD | Freq: Every day | CUTANEOUS | Status: DC

## 2016-03-29 NOTE — Progress Notes (Signed)
Patient ID: Bob Chiquito, MD, male   DOB: 06/12/1955, 61 y.o.   MRN: [id number]  BP 123/78 (BP Location: Left Arm)   Pulse 98   Temp 98 F (36.7 C) (Oral)   Resp 17   SpO2 98%  Alert and oriented x 4 Speech is clear and fluent Drain in place Moving All extremities well Wound is clean, dry, flat Awaiting TEE

## 2016-03-29 NOTE — Progress Notes (Signed)
    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 No tenderness. 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 aspirin EC  81 mg Oral Daily  .  ceFAZolin (ANCEF) IV  2 g Intravenous Q8H  . gabapentin  100 mg Oral TID  . rifampin  300 mg Oral Q12H  . senna  1 tablet Oral BID  . sodium chloride  1,000 mL Intravenous Once  . sodium chloride flush  3 mL Intravenous Q12H    Objective: Vital signs in last 24 hours: Temp:  [98 F (36.7 C)-99.1 F (37.3 C)] 98.3 F (36.8 C) (01/01 1422) Pulse Rate:  [84-98] 85 (01/01 1422) Resp:  [16-17] 16 (01/01 1422) BP: (122-129)/(77-78) 122/77 (01/01 1422) SpO2:  [97 %-98 %] 98 % (01/01 1422) Physical Exam  Constitutional: He is oriented to person, place, and time. He appears well-developed and well-nourished. No distress.  HENT:  Mouth/Throat: Oropharynx is clear and moist. No oropharyngeal exudate.  Cardiovascular: Normal rate, regular rhythm and normal heart sounds. Exam reveals no gallop and no friction rub.  No murmur heard.  Pulmonary/Chest: Effort normal and breath sounds normal. No respiratory distress. He has no wheezes.  Neck: drain in place steri strips in place as well. Mild right superior side fullness, firm, not fluctuant Skin: Skin is warm and dry. No rash noted. No erythema.  Psychiatric: He has a normal mood and affect. His behavior is normal.     Lab Results  Recent Labs  03/27/16 0446  WBC 10.8*    HGB 14.6  HCT 42.4  NA 139  K 3.7  CL 103  CO2 26  BUN 11  CREATININE 0.94   Liver Panel  Recent Labs  03/27/16 0446  PROT 6.9  ALBUMIN 3.2*  AST 22  ALT 41  ALKPHOS 104  BILITOT 1.0   Lab Results  Component Value Date   ESRSEDRATE 41 (H) 03/24/2016    Microbiology: 12/27 blood cx NGTD Studies/Results: No results found.   Assessment/Plan: 61yo male with MSSA post surgical site infection/cervical wound s/p debridement on 12/26 c/b secondary bacteremia  -continue with cefazolin 2gm IV Q 8hr plus oral rifampin 300mg  BID x 6 wk using 12/27 as day 1 - will make him NPO after MN for his 10 am TEE to rule out any cardiac device involvement - will place picc line order in - has minimal drainage, defer to NSGY to it is feasible to remove drain  transaminitis = resolved  Dr Megan Salon to see tomorrow [facility] for Infectious Diseases Cell: [phone] Pager: [phone]  03/29/2016, 3:16 PM

## 2016-03-30 ENCOUNTER — Encounter (HOSPITAL_COMMUNITY): Payer: Self-pay

## 2016-03-30 ENCOUNTER — Inpatient Hospital Stay (HOSPITAL_COMMUNITY)
Admit: 2016-03-30 | Discharge: 2016-03-30 | Disposition: A | Discharge status: Home / Self Care | Payer: BC Managed Care – PPO | Attending: Cardiovascular Disease | Admitting: Cardiovascular Disease

## 2016-03-30 ENCOUNTER — Encounter (HOSPITAL_COMMUNITY)
Admission: EM | Disposition: A | Discharge status: Home Health | Payer: Self-pay | Source: Home / Self Care | Attending: Neurosurgery

## 2016-03-30 DIAGNOSIS — I340 Nonrheumatic mitral (valve) insufficiency: Secondary | ICD-10-CM

## 2016-03-30 HISTORY — PX: TEE WITHOUT CARDIOVERSION: SHX5443

## 2016-03-30 SURGERY — ECHOCARDIOGRAM, TRANSESOPHAGEAL
Anesthesia: Moderate Sedation

## 2016-03-30 MED ORDER — RIFAMPIN 300 MG PO CAPS: 300.0000 mg | ORAL_CAPSULE | Freq: Two times a day (BID) | ORAL | 1 refills | Status: DC

## 2016-03-30 MED ORDER — HEPARIN SOD (PORK) LOCK FLUSH 100 UNIT/ML IV SOLN
250.0000 [IU] | INTRAVENOUS | Status: AC | PRN
  Administered 2016-03-30: 250 [IU]

## 2016-03-30 MED ORDER — CEFAZOLIN IV (FOR PTA / DISCHARGE USE ONLY): 2.0000 g | Freq: Three times a day (TID) | INTRAVENOUS | 0 refills | Status: DC

## 2016-03-30 MED ORDER — CEFAZOLIN SODIUM-DEXTROSE 2-4 GM/100ML-% IV SOLN: 2.0000 g | Freq: Three times a day (TID) | INTRAVENOUS | 1 refills | Status: DC

## 2016-03-30 MED ORDER — BUTAMBEN-TETRACAINE-BENZOCAINE 2-2-14 % EX AERO
INHALATION_SPRAY | CUTANEOUS | Status: DC | PRN
  Administered 2016-03-30: 2 via TOPICAL

## 2016-03-30 MED ORDER — FENTANYL CITRATE (PF) 100 MCG/2ML IJ SOLN
INTRAMUSCULAR | Status: DC | PRN
  Administered 2016-03-30 (×4): 25 ug via INTRAVENOUS

## 2016-03-30 MED ORDER — MIDAZOLAM HCL 5 MG/ML IJ SOLN
INTRAMUSCULAR | Status: AC
  Filled 2016-03-30: qty 2

## 2016-03-30 MED ORDER — SODIUM CHLORIDE 0.9% FLUSH
10.0000 mL | INTRAVENOUS | Status: DC | PRN
  Administered 2016-03-30: 10 mL
  Filled 2016-03-30: qty 40

## 2016-03-30 MED ORDER — FENTANYL CITRATE (PF) 100 MCG/2ML IJ SOLN
INTRAMUSCULAR | Status: AC
  Filled 2016-03-30: qty 2

## 2016-03-30 MED ORDER — MIDAZOLAM HCL 10 MG/2ML IJ SOLN
INTRAMUSCULAR | Status: DC | PRN
  Administered 2016-03-30 (×3): 2 mg via INTRAVENOUS

## 2016-03-30 NOTE — Progress Notes (Signed)
Peripherally Inserted Central Catheter/Midline Placement  The IV Nurse has discussed with the patient and/or persons authorized to consent for the patient, the purpose of this procedure and the potential benefits and risks involved with this procedure.  The benefits include less needle sticks, lab draws from the catheter, and the patient may be discharged home with the catheter. Risks include, but not limited to, infection, bleeding, blood clot (thrombus formation), and puncture of an artery; nerve damage and irregular heartbeat and possibility to perform a PICC exchange if needed/ordered by physician.  Alternatives to this procedure were also discussed.  Bard Power PICC patient education guide, fact sheet on infection prevention and patient information card has been provided to patient /or left at bedside.    PICC/Midline Placement Documentation        Curley Fayette, Nicolette Bang 03/30/2016, 11:46 AM

## 2016-03-30 NOTE — Progress Notes (Signed)
[facility] and [facility] teams continue to follow and are prepared for DC home when ordered. Pt has had in hospital IV ABX teaching to support independence at home.   If patient discharges after hours, please call [phone].   Bob Schmidt 03/30/2016, 8:10 AM

## 2016-03-30 NOTE — Progress Notes (Signed)
Arrived to insert PICC however pt requested to have it done after TEE done to make sure there is no problems with the pacemaker and infection.   Will check back after TEE done.   Primary RN made aware too.

## 2016-03-30 NOTE — Progress Notes (Signed)
  Echocardiogram Echocardiogram Transesophageal has been performed.  Darlina Sicilian M 03/30/2016, 10:26 AM

## 2016-03-30 NOTE — Op Note (Signed)
INDICATIONS: infective endocarditis  PROCEDURE:   Informed consent was obtained prior to the procedure. The risks, benefits and alternatives for the procedure were discussed and the patient comprehended these risks.  Risks include, but are not limited to, cough, sore throat, vomiting, nausea, somnolence, esophageal and stomach trauma or perforation, bleeding, low blood pressure, aspiration, pneumonia, infection, trauma to the teeth and death.    After a procedural time-out, the oropharynx was anesthetized with 20% benzocaine spray.   During this procedure the patient was administered a total of Versed 6 mg and Fentanyl 100 mcg to achieve and maintain moderate conscious sedation.  [name] heart rate, blood pressure, and oxygen saturationweare monitored continuously during the procedure. The period of conscious sedation was 19 minutes, of which I was present face-to-face 100% of this time.  The transesophageal probe was inserted in the esophagus and stomach without difficulty and multiple views were obtained.  The patient was kept under observation until the patient left the procedure room.  The patient left the procedure room in stable condition.   Agitated microbubble saline contrast was not administered.  COMPLICATIONS:    There were no immediate complications.  FINDINGS:  Normal TEE. No vegetations seen on the valves or pacemaker leads  RECOMMENDATIONS:   No echo evidence for endocarditis.  Time Spent Directly with the Patient:  30 minutes   [name] 03/30/2016, 9:47 AM

## 2016-03-30 NOTE — Discharge Summary (Signed)
Physician Discharge Summary  Patient ID: Bob ARNEY, MD MRN: [id number] DOB/AGE: May 27, 1955 61 y.o.  Admit date: 03/23/2016 Discharge date: 03/30/2016  Admission Diagnoses:  Discharge Diagnoses:  Active Problems:   Hematoma of neck   Wound infection after surgery   Abscess   Staphylococcus aureus bacteremia   Discharged Condition: good  Hospital Course: Patient admitted to the hospital with a post action after three-level anterior cervical decompression and fusion. Patient taken to operating room and his wound was washed out without complication. No evidence of esophageal injury. Patient subsequently underwent barium swallow which was negative for leak. He also underwent transesophageal echocardiogram which was negative for endocarditis. Patient's cultures grow methicillin sensitive staph aureus. He is on Ancef with rifampin. Plan is for at least a 6 week IV antibiotic course. Patient will follow-up with me in 2 weeks.  Consults:   Significant Diagnostic Studies:   Treatments:   Discharge Exam: Blood pressure 116/81, pulse 80, temperature 97.6 F (36.4 C), temperature source Oral, resp. rate 12, height 6' 2"  (1.88 m), weight 108.4 kg (239 lb), SpO2 98 %. Patient is awake and alert. He is oriented and appropriate. His wound is healing well. Motor examination is stable with some chronic mild shoulder abduction weakness and wrist extension weakness and triceps weakness on the left side. Sensory examination is stable with some decreased sensation chronically in his left C4, C5, C7 nerve root distributions but all sensation pain and weakness are improved from preop. Chest and abdomen are benign. Extremities are free from injury deformity.  Disposition: 01-Home or Self Care  Discharge Instructions    Face-to-face encounter (required for Medicare/Medicaid patients)    Complete by:  As directed    I Kitrina Maurin A certify that this patient is under my care and that I, or a nurse  practitioner or physician's assistant working with me, had a face-to-face encounter that meets the physician face-to-face encounter requirements with this patient on 03/30/2016. The encounter with the patient was in whole, or in part for the following medical condition(s) which is the primary reason for home health care (List medical condition): wound infection   The encounter with the patient was in whole, or in part, for the following medical condition, which is the primary reason for home health care:  wound infection   I certify that, based on my findings, the following services are medically necessary home health services:  Nursing   Reason for Medically Necessary Home Health Services:  Skilled Nursing- Changes in Medication/Medication Management   My clinical findings support the need for the above services:  Unable to leave home safely without assistance and/or assistive device   Further, I certify that my clinical findings support that this patient is homebound due to:  Ambulates short distances less than 300 feet   Home Health    Complete by:  As directed    To provide the following care/treatments:  RN   Home infusion instructions [facility] May follow [facility] Dosing Protocol; May administer Cathflo as needed to maintain patency of vascular access device.; Flushing of vascular access device: per [facility] Protocol: 0.9% NaCl pre/post medica...    Complete by:  As directed    Instructions:  May follow [place] Dosing Protocol   Instructions:  May administer Cathflo as needed to maintain patency of vascular access device.   Instructions:  Flushing of vascular access device: per [facility] Protocol: 0.9% NaCl pre/post medication administration and prn patency; Heparin 100 u/ml, 92m for implanted ports  and Heparin 10u/ml, 2m for all other central venous catheters.   Instructions:  May follow [facility] Anaphylaxis Protocol for First Dose Administration in the home: 0.9% NaCl at 25-50 ml/hr to  maintain IV access for protocol meds. Epinephrine 0.3 ml IV/IM PRN and Benadryl 25-50 IV/IM PRN s/s of anaphylaxis.   Instructions:  [facility] Coordinator (RN) to assist per patient IV care needs in the home PRN.     Allergies as of 03/30/2016   No Known Allergies     Medication List    TAKE these medications   acetaminophen 500 MG tablet Commonly known as:  TYLENOL Take 500-1,000 mg by mouth every 6 (six) hours as needed for moderate pain or headache.   aspirin EC 81 MG tablet Take 81 mg by mouth daily.   ceFAZolin 2-4 GM/100ML-% IVPB Commonly known as:  ANCEF Inject 100 mLs (2 g total) into the vein every 8 (eight) hours.   ceFAZolin IVPB Commonly known as:  ANCEF Inject 2 g into the vein every 8 (eight) hours. Indication:  bacteremia Last Day of Therapy:  05/05/16 Labs - Once weekly:  CBC/D and BMP, Labs - Every other week:  ESR and CRP   cyclobenzaprine 10 MG tablet Commonly known as:  FLEXERIL Take 10 mg by mouth 3 (three) times daily as needed for muscle spasms.   NASACORT ALLERGY 24HR NA Place 2 sprays into both nostrils daily.   oxyCODONE 5 MG immediate release tablet Commonly known as:  Oxy IR/ROXICODONE Take 5 mg by mouth 2 (two) times daily as needed for severe pain.   rifampin 300 MG capsule Commonly known as:  RIFADIN Take 1 capsule (300 mg total) by mouth every 12 (twelve) hours.            Home Infusion Instuctions        Start     Ordered   03/30/16 0000  Home infusion instructions [facility] May follow [facility]osing Protocol; May administer Cathflo as needed to maintain patency of vascular access device.; Flushing of vascular access device: [facility]: 0.9% NaCl pre/post medica...    Question Answer Comment  Instructions May follow AGenoaDosing Protocol   Instructions May administer Cathflo as needed to maintain patency of vascular access device.   Instructions Flushing of vascular access device: per [facility] Protocol: 0.9% NaCl pre/post medication administration and prn patency; Heparin 100 u/ml, 544mfor implanted ports and Heparin 10u/ml, 1m17mor all other central venous catheters.   Instructions May follow [facility] Anaphylaxis Protocol for First Dose Administration in the home: 0.9% NaCl at 25-50 ml/hr to maintain IV access for protocol meds. Epinephrine 0.3 ml IV/IM PRN and Benadryl 25-50 IV/IM PRN s/s of anaphylaxis.   Instructions [facility] Infusion Coordinator (RN) to assist per patient IV care needs in the home PRN.      03/30/16 1313     Follow-up Information    Cathi Hazan A, MD. Schedule an appointment as soon as possible for a visit in 2 week(s).   Specialty:  Neurosurgery Contact information: [address] [phone]        Signed: POOCharlie Pitter2/2018, 1:14 PM

## 2016-03-31 ENCOUNTER — Encounter (HOSPITAL_COMMUNITY): Payer: Self-pay | Admitting: Cardiovascular Disease

## 2016-04-01 ENCOUNTER — Encounter: Payer: Self-pay | Admitting: Internal Medicine

## 2016-04-23 ENCOUNTER — Telehealth: Payer: Self-pay | Admitting: *Deleted

## 2016-04-23 NOTE — Telephone Encounter (Signed)
Patient had been advised to call [facility] if he had a fever of 101.  Has been exposed at work to [employer], [employer] are out of work.  Fever started last night, non-productive cough,and body aches.  Patient advised to call PCP for possible treatment for Flu.  Patient voiced concern about possible drug interactions between Flu treatment and current  Ancef and rifampin treatment.  Advised that the PCP would check drug interactions.  Patient agreed to call his PCP.

## 2016-04-26 ENCOUNTER — Encounter: Payer: Self-pay | Admitting: Infectious Diseases

## 2016-04-27 ENCOUNTER — Telehealth: Payer: Self-pay | Admitting: Pharmacist

## 2016-04-27 ENCOUNTER — Telehealth: Payer: Self-pay | Admitting: Infectious Diseases

## 2016-04-27 ENCOUNTER — Telehealth: Payer: Self-pay | Admitting: *Deleted

## 2016-04-27 NOTE — Telephone Encounter (Signed)
Called and gave Amy, [facility] Pharmacist, orders to repeat patient's labs tomorrow - ordered a CMET to check LFTs and repeat K level.

## 2016-04-27 NOTE — Telephone Encounter (Signed)
Called pt and left phone note thanks

## 2016-04-27 NOTE — Telephone Encounter (Signed)
Patient called to update. He has 1 pill left of Tamiflu, but is still experiencing fevers, lethargy, anorexia, chills, aches.  He has now developed a rash on his abdomen and thighs. Patient on cefazolin 2gm q8hrs and rifampin 300mg  q12 hours.  Labs received today, given with phone call to [facility] for assistance.  Dr. Johnnye Sima, please advise.

## 2016-04-27 NOTE — Telephone Encounter (Signed)
Pt with MSSA bacteremia post cervical spine surgery, prev pacer. His TEE was (-). He was recently exposed to "flu outbreak" at work. Continues to feel poorly- weak, fine "viral exanthem", non-pruritic, temp was 101 on 1-25. Anorexia. Is on lat day of tamiflu.  1-31 is the 6th week of anbx. Completes on 2-7.  No problems with PIC.  No icterus.  Will recheck his potassium (5.7 last draw 1-29), will check his lfts (not done 1-29).

## 2016-04-28 ENCOUNTER — Telehealth: Payer: Self-pay

## 2016-04-28 ENCOUNTER — Encounter: Payer: Self-pay | Admitting: Infectious Diseases

## 2016-04-28 NOTE — Telephone Encounter (Signed)
Patient is calling to give update of medications.   He stopped the Tamiflu which he had taken for 9 days.  Stopped the IV rifampin which he has taken for 5 weeks. He slept well last night. His plan is not to continue the Tamiflu or the rifampin . The home health nurse was there today and labs were drawn. He is scheduled to see Dr Johnnye Sima on May 04, 2016.  Laverle Patter, RN

## 2016-04-29 ENCOUNTER — Telehealth: Payer: Self-pay | Admitting: *Deleted

## 2016-04-29 ENCOUNTER — Emergency Department: Payer: BC Managed Care – PPO

## 2016-04-29 ENCOUNTER — Observation Stay
Admission: EM | Admit: 2016-04-29 | Discharge: 2016-04-30 | Disposition: A | Discharge status: Home / Self Care | Payer: BC Managed Care – PPO | Attending: Internal Medicine | Admitting: Internal Medicine

## 2016-04-29 ENCOUNTER — Telehealth: Payer: Self-pay | Admitting: Infectious Diseases

## 2016-04-29 ENCOUNTER — Telehealth: Payer: Self-pay | Admitting: Cardiology

## 2016-04-29 ENCOUNTER — Ambulatory Visit (INDEPENDENT_AMBULATORY_CARE_PROVIDER_SITE_OTHER): Payer: BC Managed Care – PPO | Admitting: *Deleted

## 2016-04-29 ENCOUNTER — Encounter: Payer: Self-pay | Admitting: Emergency Medicine

## 2016-04-29 DIAGNOSIS — K759 Inflammatory liver disease, unspecified: Secondary | ICD-10-CM

## 2016-04-29 DIAGNOSIS — I495 Sick sinus syndrome: Secondary | ICD-10-CM | POA: Diagnosis not present

## 2016-04-29 DIAGNOSIS — D696 Thrombocytopenia, unspecified: Secondary | ICD-10-CM | POA: Diagnosis not present

## 2016-04-29 DIAGNOSIS — R740 Nonspecific elevation of levels of transaminase and lactic acid dehydrogenase [LDH]: Secondary | ICD-10-CM | POA: Diagnosis not present

## 2016-04-29 DIAGNOSIS — Z7982 Long term (current) use of aspirin: Secondary | ICD-10-CM | POA: Insufficient documentation

## 2016-04-29 DIAGNOSIS — R21 Rash and other nonspecific skin eruption: Secondary | ICD-10-CM | POA: Diagnosis not present

## 2016-04-29 DIAGNOSIS — Z950 Presence of cardiac pacemaker: Secondary | ICD-10-CM | POA: Insufficient documentation

## 2016-04-29 DIAGNOSIS — E875 Hyperkalemia: Secondary | ICD-10-CM | POA: Insufficient documentation

## 2016-04-29 DIAGNOSIS — R509 Fever, unspecified: Secondary | ICD-10-CM | POA: Diagnosis present

## 2016-04-29 DIAGNOSIS — R502 Drug induced fever: Principal | ICD-10-CM | POA: Insufficient documentation

## 2016-04-29 LAB — CBC WITH DIFFERENTIAL/PLATELET
Band Neutrophils: 0 %
Basophils Absolute: 0.1 10*3/uL (ref 0–0.1)
Basophils Relative: 2 %
Blasts: 0 %
Eosinophils Absolute: 0.2 10*3/uL (ref 0–0.7)
Eosinophils Relative: 3 %
HCT: 45 % (ref 40.0–52.0)
Hemoglobin: 15.3 g/dL (ref 13.0–18.0)
Lymphocytes Relative: 18 %
Lymphs Abs: 1.3 10*3/uL (ref 1.0–3.6)
MCH: 28.7 pg (ref 26.0–34.0)
MCHC: 34 g/dL (ref 32.0–36.0)
MCV: 84.4 fL (ref 80.0–100.0)
Metamyelocytes Relative: 0 %
Monocytes Absolute: 0.6 10*3/uL (ref 0.2–1.0)
Monocytes Relative: 9 %
Myelocytes: 0 %
Neutro Abs: 4.9 10*3/uL (ref 1.4–6.5)
Neutrophils Relative %: 68 %
Other: 0 %
Platelets: 104 10*3/uL — ABNORMAL LOW (ref 150–440)
Promyelocytes Absolute: 0 %
RBC: 5.33 MIL/uL (ref 4.40–5.90)
RDW: 13.7 % (ref 11.5–14.5)
WBC: 7.1 10*3/uL (ref 3.8–10.6)
nRBC: 0 /100 WBC

## 2016-04-29 LAB — COMPREHENSIVE METABOLIC PANEL
ALT: 191 U/L — ABNORMAL HIGH (ref 17–63)
AST: 223 U/L — ABNORMAL HIGH (ref 15–41)
Albumin: 4 g/dL (ref 3.5–5.0)
Alkaline Phosphatase: 202 U/L — ABNORMAL HIGH (ref 38–126)
Anion gap: 9 (ref 5–15)
BUN: 8 mg/dL (ref 6–20)
CO2: 24 mmol/L (ref 22–32)
Calcium: 8.5 mg/dL — ABNORMAL LOW (ref 8.9–10.3)
Chloride: 101 mmol/L (ref 101–111)
Creatinine, Ser: 0.9 mg/dL (ref 0.61–1.24)
GFR calc Af Amer: >60 mL/min (ref >60)
GFR calc non Af Amer: >60 mL/min (ref >60)
Glucose, Bld: 119 mg/dL — ABNORMAL HIGH (ref 65–99)
Potassium: 3.9 mmol/L (ref 3.5–5.1)
Sodium: 134 mmol/L — ABNORMAL LOW (ref 135–145)
Total Bilirubin: 1.5 mg/dL — ABNORMAL HIGH (ref 0.3–1.2)
Total Protein: 7.2 g/dL (ref 6.5–8.1)

## 2016-04-29 LAB — URINALYSIS, COMPLETE (UACMP) WITH MICROSCOPIC
Bacteria, UA: NONE SEEN
Bilirubin Urine: NEGATIVE
Glucose, UA: NEGATIVE mg/dL
Hgb urine dipstick: NEGATIVE
Ketones, ur: 5 mg/dL — AB
Leukocytes, UA: NEGATIVE
Nitrite: NEGATIVE
Protein, ur: 30 mg/dL — AB
Specific Gravity, Urine: 1.015 (ref 1.005–1.030)
Squamous Epithelial / LPF: NONE SEEN
pH: 6 (ref 5.0–8.0)

## 2016-04-29 LAB — LACTIC ACID, PLASMA: Lactic Acid, Venous: 1.1 mmol/L (ref 0.5–1.9)

## 2016-04-29 LAB — CK: Total CK: 44 U/L — ABNORMAL LOW (ref 49–397)

## 2016-04-29 MED ORDER — DIPHENHYDRAMINE HCL 25 MG PO CAPS: 25.0000 mg | ORAL_CAPSULE | Freq: Four times a day (QID) | ORAL | Status: DC | PRN

## 2016-04-29 MED ORDER — ENOXAPARIN SODIUM 40 MG/0.4ML ~~LOC~~ SOLN: 40.0000 mg | SUBCUTANEOUS | Status: DC

## 2016-04-29 MED ORDER — TRIAMCINOLONE ACETONIDE 55 MCG/ACT NA AERO: 1.0000 | INHALATION_SPRAY | Freq: Every day | NASAL | Status: DC

## 2016-04-29 MED ORDER — ONDANSETRON HCL 4 MG/2ML IJ SOLN: 4.0000 mg | Freq: Four times a day (QID) | INTRAMUSCULAR | Status: DC | PRN

## 2016-04-29 MED ORDER — FLUTICASONE PROPIONATE 50 MCG/ACT NA SUSP
1.0000 | Freq: Every day | NASAL | Status: DC
  Filled 2016-04-29: qty 16

## 2016-04-29 MED ORDER — SODIUM CHLORIDE 0.9 % IV SOLN
Freq: Once | INTRAVENOUS | Status: AC
  Administered 2016-04-29: 19:00:00 via INTRAVENOUS

## 2016-04-29 MED ORDER — SODIUM CHLORIDE 0.9 % IV SOLN
INTRAVENOUS | Status: DC
  Administered 2016-04-29 – 2016-04-30 (×2): via INTRAVENOUS

## 2016-04-29 MED ORDER — ACETAMINOPHEN 500 MG PO TABS: 500.0000 mg | ORAL_TABLET | Freq: Four times a day (QID) | ORAL | Status: DC | PRN

## 2016-04-29 MED ORDER — ONDANSETRON HCL 4 MG PO TABS: 4.0000 mg | ORAL_TABLET | Freq: Four times a day (QID) | ORAL | Status: DC | PRN

## 2016-04-29 MED ORDER — ASPIRIN EC 81 MG PO TBEC
81.0000 mg | DELAYED_RELEASE_TABLET | Freq: Every day | ORAL | Status: DC
  Filled 2016-04-29: qty 1

## 2016-04-29 NOTE — ED Notes (Signed)
Dr. Malinda in room to assess patient.  Will continue to monitor.   

## 2016-04-29 NOTE — ED Notes (Signed)
Patient has had rash x 3-4 days.  Rash is to torso, upper legs, and arms.

## 2016-04-29 NOTE — Telephone Encounter (Signed)
Pt with MSSA bacteremia post cervical spine surgery, prev pacer. His TEE was (-).  1-31 is the 6th week of anbx. Completes on 2-7.  He continues to feel poorly.  His K was 6.2, Alk P 164, AST 157 ALT 105 I have asked him to go to ER or urgent care.  I have asked him to stop all his antibiotics. I asked him to call if he has further issues, I am on call this weekend.   He has appt on 05-04-16

## 2016-04-29 NOTE — ED Notes (Signed)
Pt states he hasn't had anything to eat all day and asked if he can have something; this RN brought a sandwich tray, and 2% milk for the patient; pt expressed gratitude.

## 2016-04-29 NOTE — Telephone Encounter (Signed)
Spoke with pt and reminded pt of remote transmission that is due today. Pt verbalized understanding.   

## 2016-04-29 NOTE — Telephone Encounter (Signed)
Patient called with update. His rash is resolving. He held the Rifampin 1/31, but restarted today. He is still having fevers that wake him when he sleeps.  He states he is worried about Drug Fever. Patient has neurosurgery this afternoon, wanted to know if he should come here for Dr Johnnye Sima to see him. Please advise.  Landis Gandy, RN

## 2016-04-29 NOTE — ED Triage Notes (Signed)
Pt in via POV; pt currently being followed by infectious disease due to recent neck abscess and staph infection.  Pt with PICC line, currently taking IV antibiotics.  Pt with blood work done via home health 12/31; potassium 6.2.  Pt infectious disease MD advise pt to stop antibiotics and be evaluated in ED.  Pt also reports fever x 1 week, pt with rash to anterior and posterior trunk.  NAD noted at this time.

## 2016-04-29 NOTE — ED Provider Notes (Addendum)
[facility] Emergency Department Provider Note   ____________________________________________   First MD Initiated Contact with Patient 04/29/16 1722     (approximate)  I have reviewed the triage vital signs and the nursing notes.   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 They report that he had spinal surgery in December and then got an infection of the large neck abscess. He has been on Ancef 2 g 3 times a day and high doses of rifampin as well since then. He was scheduled to get 6 weeks of both. He began to get better feel better no more fever but then went back to work just over week ago and seems to caught the flu. He started taking Tamiflu however he continues to feel sicker and has developed a maculopapular rash over most of his body which blanches. This rash is gotten worse. His potassium which was drawn by home health through the PICC line was 5.7 on Monday and 6. one on Wednesday. He's been running a fever of 100 slightly higher sometimes and been taking Tylenol 500 mg 2 g once or twice a day most days but not every day for about a week. His liver functions have been going up as well. His ID doctor today thought perhaps he had developed a drug fever and told him to stop the Ancef and rifampin.   Past Medical History:  Diagnosis Date  . Arrhythmia   . Cancer ([place])   . Cardiac pacemaker   . H/O basal cell carcinoma excision   . History of basal cell carcinoma   . Hyperlipidemia   . Internal hemorrhoids   . Lumbar disc disease   . Presence of permanent cardiac pacemaker    medtronic Oct 2010  . PVC (premature ventricular contraction)   . Sinus node dysfunction [facility])     Patient Active Problem List   Diagnosis Date Noted  . Abscess   . Staphylococcus aureus bacteremia   . Hematoma of neck 03/23/2016  . Wound infection after surgery 03/23/2016  . PVC (premature  ventricular contraction)   . Sinus node dysfunction ([facility])   . Cardiac pacemaker     Past Surgical History:  Procedure Laterality Date  . ANAL FISSURE REPAIR WITH PARTIAL LATERAL SPHINCTEROTOMY  1998  . BACK SURGERY  12/18/2002  . CHOLECYSTECTOMY  01/2003  . COLONOSCOPY  10/19/2012  . ESOPHAGOSCOPY N/A 03/23/2016   Procedure: ESOPHAGOSCOPY;  Surgeon: Izora Gala, MD;  Location: [place];  Service: ENT;  Laterality: N/A;  . INCISION AND DRAINAGE PERIRECTAL ABSCESS  11/2003  . INSERT / REPLACE / REMOVE PACEMAKER    . INSERT/REPLACE/REMOVE PACEMAKER  01/24/2009  . NECK SURGERY    . TEE WITHOUT CARDIOVERSION N/A 03/30/2016   Procedure: TRANSESOPHAGEAL ECHOCARDIOGRAM (TEE);  Surgeon: Sanda Klein, MD;  Location: [facility] ENDOSCOPY;  Service: Cardiovascular;  Laterality: N/A;  . WOUND EXPLORATION N/A 03/23/2016   Procedure: Incision and Drainage of Cervical Wound;  Surgeon: Leeroy Cha, MD;  Location: [facility];  Service: Neurosurgery;  Laterality: N/A;    Prior to Admission medications   Medication Sig Start Date End Date Taking? Authorizing Provider  acetaminophen (TYLENOL) 500 MG tablet Take 500-1,000 mg by mouth every 6 (six) hours as needed for moderate pain or headache.    Historical Provider, MD  aspirin EC 81 MG tablet Take 81 mg by mouth daily.    Historical  Provider, MD  cyclobenzaprine (FLEXERIL) 10 MG tablet Take 10 mg by mouth 3 (three) times daily as needed for muscle spasms.  03/11/16   Historical Provider, MD  oxyCODONE (OXY IR/ROXICODONE) 5 MG immediate release tablet Take 5 mg by mouth 2 (two) times daily as needed for severe pain.    Historical Provider, MD  Triamcinolone Acetonide (NASACORT ALLERGY 24HR NA) Place 2 sprays into both nostrils daily.    Historical Provider, MD    Allergies Patient has no known allergies.  Family History  Problem Relation Age of Onset  . Sudden death Father   . Diabetes type II Sister     Social History Social History  Substance Use Topics    . Smoking status: Never Smoker  . Smokeless tobacco: Never Used  . Alcohol use Yes    Review of Systems Constitutional: fever Eyes: No visual changes. ENT: No sore throatAt present. He had a sore throat earlier in the week. Cardiovascular: Denies chest pain. Respiratory: Denies shortness of breath. Gastrointestinal: No abdominal pain.  No nausea, no vomiting.  No diarrhea.  No constipation. Genitourinary: Negative for dysuria. Musculoskeletal: Negative for back pain. Skin: See H&P for description of rash.  10-point ROS otherwise negative.  ____________________________________________   PHYSICAL EXAM:  VITAL SIGNS: ED Triage Vitals [04/29/16 1648]  Enc Vitals Group     BP 118/70     Pulse Rate 92     Resp 18     Temp 98.2 F (36.8 C)     Temp Source Oral     SpO2 92 %     Weight 215 lb (97.5 kg)     Height 6\' 2"  (1.88 m)     Head Circumference      Peak Flow      Pain Score      Pain Loc      Pain Edu?      Excl. in [place]?     Constitutional: Alert and oriented. Well appearing and in no acute distress. Eyes: Conjunctivae are normal. PERRL. EOMI. Head: Atraumatic. Nose: No congestion/rhinnorhea. Mouth/Throat: Mucous membranes are moist.  Oropharynx non-erythematous. Neck: No stridor.   Cardiovascular: Normal rate, regular rhythm. Grossly normal heart sounds.  Good peripheral circulation. Respiratory: Normal respiratory effort.  No retractions. Lungs CTAB. Gastrointestinal: Soft Slight tenderness on palpation over the liver. No distention. No abdominal bruits. No CVA tenderness. Musculoskeletal: No lower extremity tenderness nor edema.  No joint effusions. Neurologic:  Normal speech and language. No gross focal neurologic deficits are appreciated. No gait instability. Skin:  Patient has a fine reddish rash over his body which blanches easily on palpation is not itchy at present Psychiatric: Mood and affect are normal. Speech and behavior are  normal.  ____________________________________________   LABS (all labs ordered are listed, but only abnormal results are displayed)  Labs Reviewed  COMPREHENSIVE METABOLIC PANEL - Abnormal; Notable for the following:       Result Value   Sodium 134 (*)    Glucose, Bld 119 (*)    Calcium 8.5 (*)    AST 223 (*)    ALT 191 (*)    Alkaline Phosphatase 202 (*)    Total Bilirubin 1.5 (*)    All other components within normal limits  CBC WITH DIFFERENTIAL/PLATELET - Abnormal; Notable for the following:    Platelets 104 (*)    All other components within normal limits  URINALYSIS, COMPLETE (UACMP) WITH MICROSCOPIC - Abnormal; Notable for the following:    Color, Urine  AMBER (*)    APPearance CLEAR (*)    Ketones, ur 5 (*)    Protein, ur 30 (*)    All other components within normal limits  LACTIC ACID, PLASMA  LACTIC ACID, PLASMA   ____________________________________________  EKG  EKG read and interpreted by me shows normal sinus rhythm rate of 95 normal axis no acute ST-T wave changes ____________________________________________  RADIOLOGY   ____________________________________________   PROCEDURES  Procedure(s) performed:   Procedures  Critical Care performed:   ____________________________________________   INITIAL IMPRESSION / ASSESSMENT AND PLAN / ED COURSE  Pertinent labs & imaging results that were available during my care of the patient were reviewed by me and considered in my medical decision making (see chart for details).  Discussed liver functions with poison control they feel confident that he is not Tylenol toxic at did not think so but I wanted to double check  Additionally patient has been drinking a lot of water today but is still feeling weak and when he sits up his gets weaker and he gets tachycardic heart rate goes from 85 up to 102.  Clinical Course as of Apr 29 1826  Thu Apr 29, 2016  [facility]: 28.7 [PM]  1807 Color, Urine: Luetta Nutting  [PM]    Clinical Course User Index [PM] Nena Polio, MD     ____________________________________________   FINAL CLINICAL IMPRESSION(S) / ED DIAGNOSES  Final diagnoses:  Drug induced fever  Hepatitis      NEW MEDICATIONS STARTED DURING THIS VISIT:  New Prescriptions   No medications on file     Note:  This document was prepared using Dragon voice recognition software and may include unintentional dictation errors.    Nena Polio, MD 04/29/16 Greer Ee    Nena Polio, MD 04/29/16 [phone]

## 2016-04-29 NOTE — Progress Notes (Signed)
[facility]  Active pt with [facility] at this time.  [facility] is providing HHRN and Home Infusion Pharmacy services for home IV ABX- Pt on Cefazolin 2 Grams IV Q 8 hours thru 05-05-16.  [facility] team will follow Bob Schmidt while here to support transition home when ordered.  If patient discharges after hours, please call [phone].   Larry Sierras 04/29/2016, 6:03 PM

## 2016-04-29 NOTE — H&P (Signed)
[facility] [place] NAME: Bob Schmidt    MR#:  [id number]  DATE OF BIRTH:  [birth year]  DATE OF ADMISSION:  04/29/2016  PRIMARY CARE PHYSICIAN: Adin Hector, MD   REQUESTING/REFERRING PHYSICIAN: Dr. Conni Slipper  CHIEF COMPLAINT:   Chief Complaint  Patient presents with  . Hyperkalemia  . Fever    HISTORY OF PRESENT ILLNESS:  Bob Schmidt  is a 61 y.o. male with a known history of Sinus node dysfunction status post pacemaker, hyperlipidemia, history of hemorrhoids presents to the hospital secondary to fever and rash going on for 2 days now. He had an anterior discectomy done about 6 weeks ago complicated by infected hematoma in the anterior neck region causing compression of his pharynx and esophagus and larynx. It was drained on 03/23/2016 and also had MSSA bacteremia at the same time. PICC line was placed and discharged on Ancef and rifampin. TEE done at the time did not show any endocarditis. Patient had finished 5 weeks of rifampin and Ancef. Last week he had flulike symptoms because everybody at work had flu. He started on Tamiflu and almost finished 9 out of 10 doses. He stopped it secondary to neuropsychiatric side effects. He started noticing rash non-pruritus all over his body for the last 3 days. Also having low-grade temperatures. Discussed with his ID physician and stop taking Ancef and rifampin. Home health nurse came to draw blood work, it showed elevated potassium as outpatient and also LFTs elevated. He was advised to come to the emergency room. Has a low-grade fever today, LFTs are elevated. Potassium is normal. Denies any other complaints at this time.  PAST MEDICAL HISTORY:   Past Medical History:  Diagnosis Date  . Arrhythmia   . Cancer ([place])   . Cardiac pacemaker   . H/O basal cell carcinoma excision   . History of basal cell carcinoma   . Hyperlipidemia   . Internal hemorrhoids   . Lumbar disc disease   .  Presence of permanent cardiac pacemaker    medtronic Oct 2010  . PVC (premature ventricular contraction)   . Sinus node dysfunction ([facility])     PAST SURGICAL HISTORY:   Past Surgical History:  Procedure Laterality Date  . ANAL FISSURE REPAIR WITH PARTIAL LATERAL SPHINCTEROTOMY  1998  . BACK SURGERY  12/18/2002  . CHOLECYSTECTOMY  01/2003  . COLONOSCOPY  10/19/2012  . ESOPHAGOSCOPY N/A 03/23/2016   Procedure: ESOPHAGOSCOPY;  Surgeon: Izora Gala, MD;  Location: [facility];  Service: ENT;  Laterality: N/A;  . INCISION AND DRAINAGE PERIRECTAL ABSCESS  11/2003  . INSERT / REPLACE / REMOVE PACEMAKER    . INSERT/REPLACE/REMOVE PACEMAKER  01/24/2009  . NECK SURGERY    . TEE WITHOUT CARDIOVERSION N/A 03/30/2016   Procedure: TRANSESOPHAGEAL ECHOCARDIOGRAM (TEE);  Surgeon: Sanda Klein, MD;  Location[facility] END[facility]COPY;  Service: Cardiovascular;  Laterality: N/A;  . WOUND EXPLORATION N/A 03/23/2016   Procedure: Incision and Drainage of Cervical Wound;  Surgeon: Leeroy Cha, MD;  Location: [facility];  Service: Neurosurgery;  Laterality: N/A;    SOCIAL HISTORY:   Social History  Substance Use Topics  . Smoking status: Never Smoker  . Smokeless tobacco: Never Used  . Alcohol use Yes    FAMILY HISTORY:   Family History  Problem Relation Age of Onset  . Sudden death Father   . Diabetes type II Sister     DRUG ALLERGIES:  No Known Allergies  REVIEW OF SYSTEMS:  Review of Systems  Constitutional: Positive for fever. Negative for chills, malaise/fatigue and weight loss.  HENT: Negative for ear discharge, ear pain, hearing loss and nosebleeds.   Eyes: Negative for blurred vision, double vision and photophobia.  Respiratory: Negative for cough, hemoptysis, shortness of breath and wheezing.   Cardiovascular: Negative for chest pain, palpitations, orthopnea and leg swelling.  Gastrointestinal: Negative for abdominal pain, constipation, diarrhea, heartburn, melena, nausea and vomiting.    Genitourinary: Negative for dysuria, frequency and urgency.  Musculoskeletal: Negative for myalgias and neck pain.  Skin: Positive for rash.  Neurological: Negative for dizziness, tremors, sensory change, speech change, focal weakness and headaches.  Endo/Heme/Allergies: Does not bruise/bleed easily.  Psychiatric/Behavioral: Negative for depression.    MEDICATIONS AT HOME:   Prior to Admission medications   Medication Sig Start Date End Date Taking? Authorizing Provider  acetaminophen (TYLENOL) 500 MG tablet Take 500-1,000 mg by mouth every 6 (six) hours as needed for moderate pain or headache.   Yes Historical Provider, MD  aspirin EC 81 MG tablet Take 81 mg by mouth daily.   Yes Historical Provider, MD  Triamcinolone Acetonide (NASACORT ALLERGY 24HR NA) Place 2 sprays into both nostrils daily.   Yes Historical Provider, MD      VITAL SIGNS:  Blood pressure 122/79, pulse 85, temperature 100 F (37.8 C), temperature source Oral, resp. rate 15, height 6\' 2"  (1.88 m), weight 97.5 kg (215 lb), SpO2 97 %.  PHYSICAL EXAMINATION:   Physical Exam  GENERAL:  61 y.o.-year-old patient lying in the bed with no acute distress.  EYES: Pupils equal, round, reactive to light and accommodation. No scleral icterus. Extraocular muscles intact.  HEENT: Head atraumatic, normocephalic. Oropharynx and nasopharynx clear. No oral thrush NECK:  Supple, no jugular venous distention. No thyroid enlargement, no tenderness. Above the right clavicle, post op changes, no fluctuance noted, no swelling. LUNGS: Normal breath sounds bilaterally, no wheezing, rales,rhonchi or crepitation. No use of accessory muscles of respiration.  CARDIOVASCULAR: S1, S2 normal. No murmurs, rubs, or gallops. Pacemaker in place. ABDOMEN: Soft, nontender, nondistended. Bowel sounds present. No organomegaly or mass.  EXTREMITIES: No pedal edema, cyanosis, or clubbing.  NEUROLOGIC: Cranial nerves II through XII are intact. Muscle  strength 5/5 in all extremities. Sensation intact. Gait not checked.  PSYCHIATRIC: The patient is alert and oriented x 3.  SKIN: diffuse confluent macular rash all over the torso, back and proximal legs and arms noted. No pruritus.   LABORATORY PANEL:   CBC  Recent Labs Lab 04/29/16 1654  WBC 7.1  HGB 15.3  HCT 45.0  PLT 104*   ------------------------------------------------------------------------------------------------------------------  Chemistries   Recent Labs Lab 04/29/16 1654  NA 134*  K 3.9  CL 101  CO2 24  GLUCOSE 119*  BUN 8  CREATININE 0.90  CALCIUM 8.5*  AST 223*  ALT 191*  ALKPHOS 202*  BILITOT 1.5*   ------------------------------------------------------------------------------------------------------------------  Cardiac Enzymes No results for input(s): TROPONINI in the last 168 hours. ------------------------------------------------------------------------------------------------------------------  RADIOLOGY:  Dg Chest Portable 1 View  Result Date: 04/29/2016 CLINICAL DATA:  Recurrent next abscess. EXAM: PORTABLE CHEST 1 VIEW COMPARISON:  None. FINDINGS: Dual lead cardiac pacemaker in satisfactory radiographic position. Right-sided PICC line terminates at the cavoatrial junction. Cardiomediastinal silhouette is normal. Mediastinal contours appear intact. There is no evidence of focal airspace consolidation, pleural effusion or pneumothorax. Low lung volumes. Osseous structures are without acute abnormality. Soft tissues are grossly normal. IMPRESSION: No active disease. Electronically Signed   By: Linwood Dibbles.D.  On: 04/29/2016 17:56    EKG:   Orders placed or performed during the hospital encounter of 04/29/16  . EKG 12-Lead  . EKG 12-Lead  . ED EKG  . 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  #1 Fever and rash- likely drug rash -Was likely related to rifampin. Also was on Ancef and recently Tamiflu.  -Improving. Nonpruritic. We'll start Benadryl as needed, due to fevers will watch overnight. -Repeat blood cultures ordered. -ID consult requested.  #2 elevated LFTs-transaminitis, likely adverse affect from rifampin. -Recent hepatitis panel negative. Refused any recent blood transfusion. No prior liver issues. -Hold the medication and monitor.  #3 recent MSSA bacteremia following infected hematoma in the neck-blood cultures prior to placing PICC line were negative. Patient has finished 5 weeks of antibiotics. Now due to side effects, antibiotics were asked to stop by his ID physician at [facility]. -Repeat blood cultures drawn here. Low-grade fevers present. ID follow up in the hospital.  #4 hyperkalemia- none now - monitor in AM  #5 DVT prophylaxis- lovenox   All the records are reviewed and case discussed with ED provider. Management plans discussed with the patient, family and they are in agreement.  CODE STATUS: Full code  TOTAL TIME TAKING CARE OF THIS PATIENT: 60 minutes.    Jamorian Dimaria M.D on 04/29/2016 at 7:20 PM  Between 7am to 6pm - Pager - [phone]  After 6pm go to [email or web address] - password [email or web address] [facility]  Office  [phone]  CC: Primary care physician; Adin Hector, MD

## 2016-04-29 NOTE — Progress Notes (Signed)
Remote pacemaker transmission.   

## 2016-04-30 LAB — CBC
HCT: 39.7 % — ABNORMAL LOW (ref 40.0–52.0)
Hemoglobin: 13.6 g/dL (ref 13.0–18.0)
MCH: 28.9 pg (ref 26.0–34.0)
MCHC: 34.3 g/dL (ref 32.0–36.0)
MCV: 84.4 fL (ref 80.0–100.0)
Platelets: 105 10*3/uL — ABNORMAL LOW (ref 150–440)
RBC: 4.71 MIL/uL (ref 4.40–5.90)
RDW: 13.7 % (ref 11.5–14.5)
WBC: 5.9 10*3/uL (ref 3.8–10.6)

## 2016-04-30 LAB — COMPREHENSIVE METABOLIC PANEL
ALT: 184 U/L — ABNORMAL HIGH (ref 17–63)
AST: 188 U/L — ABNORMAL HIGH (ref 15–41)
Albumin: 3.4 g/dL — ABNORMAL LOW (ref 3.5–5.0)
Alkaline Phosphatase: 184 U/L — ABNORMAL HIGH (ref 38–126)
Anion gap: 6 (ref 5–15)
BUN: 9 mg/dL (ref 6–20)
CO2: 26 mmol/L (ref 22–32)
Calcium: 8.1 mg/dL — ABNORMAL LOW (ref 8.9–10.3)
Chloride: 104 mmol/L (ref 101–111)
Creatinine, Ser: 0.82 mg/dL (ref 0.61–1.24)
GFR calc Af Amer: >60 mL/min (ref >60)
GFR calc non Af Amer: >60 mL/min (ref >60)
Glucose, Bld: 105 mg/dL — ABNORMAL HIGH (ref 65–99)
Potassium: 3.9 mmol/L (ref 3.5–5.1)
Sodium: 136 mmol/L (ref 135–145)
Total Bilirubin: 1.3 mg/dL — ABNORMAL HIGH (ref 0.3–1.2)
Total Protein: 6.1 g/dL — ABNORMAL LOW (ref 6.5–8.1)

## 2016-04-30 MED ORDER — DIPHENHYDRAMINE HCL 25 MG PO CAPS: 25.0000 mg | ORAL_CAPSULE | Freq: Four times a day (QID) | ORAL | 0 refills | Status: DC | PRN

## 2016-04-30 MED ORDER — DOXYCYCLINE HYCLATE 100 MG PO TABS: 100.0000 mg | ORAL_TABLET | Freq: Two times a day (BID) | ORAL | 0 refills | Status: DC

## 2016-04-30 MED ORDER — DOXYCYCLINE HYCLATE 100 MG PO TABS: 100.0000 mg | ORAL_TABLET | Freq: Two times a day (BID) | ORAL | Status: DC

## 2016-04-30 NOTE — Discharge Summary (Signed)
[facility] [place] NAME: Bob Schmidt    MR#:  [id number]  DATE OF BIRTH:  [birth year]  DATE OF ADMISSION:  04/29/2016   ADMITTING PHYSICIAN: Gladstone Lighter, MD  DATE OF DISCHARGE: 04/30/2016  PRIMARY CARE PHYSICIAN: Tama High III, MD   ADMISSION DIAGNOSIS:  Hepatitis [K75.9] Drug induced fever [R50.2] DISCHARGE DIAGNOSIS:  Active Problems:   Drug induced fever elevated LFTs-transaminitis, likely adverse affect from rifampin. SECONDARY DIAGNOSIS:   Past Medical History:  Diagnosis Date  . Arrhythmia   . Cancer ([place])   . Cardiac pacemaker   . H/O basal cell carcinoma excision   . History of basal cell carcinoma   . Hyperlipidemia   . Internal hemorrhoids   . Lumbar disc disease   . Presence of permanent cardiac pacemaker    medtronic Oct 2010  . PVC (premature ventricular contraction)   . 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 male with a known history of Sinus node dysfunction status post pacemaker, hyperlipidemia, history of hemorrhoids presents to the hospital secondary to fever and rash going on for 2 days now.  #1 Fever and rash- likely drug rash -Was likely related to rifampin. Also was on Ancef and recently Tamiflu.  -Improving. Nonpruritic. Treated with Benadryl as needed. F/u Repeated blood cultures with PCP.  #2 elevated LFTs-transaminitis, likely adverse affect from rifampin. Better. -Recent hepatitis panel negative. Refused any recent blood transfusion. No prior liver issues. F/u as outpatient.  #3 recent MSSA bacteremia following infected hematoma in the neck-blood cultures prior to placing PICC line were negative. Patient has finished 5 weeks of antibiotics. Now due to side effects, antibiotics were asked to stop by his ID physician at [facility]. Per Dr. Ola Spurr, doxy 100 mg bid for 28 days.  # thrombocytopenia. Possible drug induced. F/u with PCP.  I  discussed with Dr. Ola Spurr, agrees to discharge home with doxy.  DISCHARGE CONDITIONS:  Stable, dischage to home today. CONSULTS OBTAINED:  Treatment Team:  Leonel Ramsay, MD DRUG ALLERGIES:  No Known Allergies DISCHARGE MEDICATIONS:   Allergies as of 04/30/2016   No Known Allergies     Medication List    TAKE these medications   acetaminophen 500 MG tablet Commonly known as:  TYLENOL Take 500-1,000 mg by mouth every 6 (six) hours as needed for moderate pain or headache.   aspirin EC 81 MG tablet Take 81 mg by mouth daily.   diphenhydrAMINE 25 mg capsule Commonly known as:  BENADRYL Take 1 capsule (25 mg total) by mouth every 6 (six) hours as needed for itching (rash).   doxycycline 100 MG tablet Commonly known as:  VIBRA-TABS Take 1 tablet (100 mg total) by mouth every 12 (twelve) hours.   NASACORT ALLERGY 24HR NA Place 2 sprays into both nostrils daily.        DISCHARGE INSTRUCTIONS:  See AVS.  If you experience worsening of your admission symptoms, develop shortness of breath, life threatening emergency, suicidal or homicidal thoughts you must seek medical attention immediately by calling 911 or calling your MD immediately  if symptoms less severe.  You Must read complete instructions/literature along with all the possible adverse reactions/side effects for all the Medicines you take and that have been prescribed to you. Take any new Medicines after you have completely understood and accpet all the possible adverse reactions/side effects.   Please note  You were cared  for by a hospitalist during your hospital stay. If you have any questions about your discharge medications or the care you received while you were in the hospital after you are discharged, you can call the unit and asked to speak with the hospitalist on call if the hospitalist that took care of you is not available. Once you are discharged, your primary care physician will handle any further  medical issues. Please note that NO REFILLS for any discharge medications will be authorized once you are discharged, as it is imperative that you return to your primary care physician (or establish a relationship with a primary care physician if you do not have one) for your aftercare needs so that they can reassess your need for medications and monitor your lab values.    On the day of Discharge:  VITAL SIGNS:  Blood pressure (!) 110/59, pulse 66, temperature 98.3 F (36.8 C), temperature source Oral, resp. rate 18, height 6\' 2"  (1.88 m), weight 231 lb 14.4 oz (105.2 kg), SpO2 96 %. PHYSICAL EXAMINATION:  GENERAL:  61 y.o.-year-old patient lying in the bed with no acute distress.  EYES: Pupils equal, round, reactive to light and accommodation. No scleral icterus. Extraocular muscles intact.  HEENT: Head atraumatic, normocephalic. Oropharynx and nasopharynx clear.  NECK:  Supple, no jugular venous distention. No thyroid enlargement, no tenderness.  LUNGS: Normal breath sounds bilaterally, no wheezing, rales,rhonchi or crepitation. No use of accessory muscles of respiration.  CARDIOVASCULAR: S1, S2 normal. No murmurs, rubs, or gallops.  ABDOMEN: Soft, non-tender, non-distended. Bowel sounds present. No organomegaly or mass.  EXTREMITIES: No pedal edema, cyanosis, or clubbing.  NEUROLOGIC: Cranial nerves II through XII are intact. Muscle strength 5/5 in all extremities. Sensation intact. Gait not checked.  PSYCHIATRIC: The patient is alert and oriented x 3.  SKIN: No obvious rash, lesion, or ulcer.  DATA REVIEW:   CBC  Recent Labs Lab 04/30/16 0546  WBC 5.9  HGB 13.6  HCT 39.7*  PLT 105*    Chemistries   Recent Labs Lab 04/30/16 0546  NA 136  K 3.9  CL 104  CO2 26  GLUCOSE 105*  BUN 9  CREATININE 0.82  CALCIUM 8.1*  AST 188*  ALT 184*  ALKPHOS 184*  BILITOT 1.3*     Microbiology Results  Results for orders placed or performed during the hospital encounter of  04/29/16  Culture, blood (routine x 2)     Status: None (Preliminary result)   Collection Time: 04/29/16  6:52 PM  Result Value Ref Range Status   Specimen Description BLOOD  LEFT ARM   Final   Special Requests   Final    BOTTLES DRAWN AEROBIC AND ANAEROBIC  AER 11 ML ANA 12 ML   Culture NO GROWTH < 12 HOURS  Final   Report Status PENDING  Incomplete  Culture, blood (routine x 2)     Status: None (Preliminary result)   Collection Time: 04/29/16  6:52 PM  Result Value Ref Range Status   Specimen Description BLOOD  RIGHT ARM  Final   Special Requests   Final    BOTTLES DRAWN AEROBIC AND ANAEROBIC  AER 11 ML ANA 10 ML   Culture NO GROWTH < 12 HOURS  Final   Report Status PENDING  Incomplete    RADIOLOGY:  Dg Chest Portable 1 View  Result Date: 04/29/2016 CLINICAL DATA:  Recurrent next abscess. EXAM: PORTABLE CHEST 1 VIEW COMPARISON:  None. FINDINGS: Dual lead cardiac pacemaker in satisfactory radiographic position.  Right-sided PICC line terminates at the cavoatrial junction. Cardiomediastinal silhouette is normal. Mediastinal contours appear intact. There is no evidence of focal airspace consolidation, pleural effusion or pneumothorax. Low lung volumes. Osseous structures are without acute abnormality. Soft tissues are grossly normal. IMPRESSION: No active disease. Electronically Signed   By: Fidela Salisbury M.D.   On: 04/29/2016 17:56     Management plans discussed with the patient, family and they are in agreement.  CODE STATUS:     Code Status Orders        Start     Ordered   04/29/16 2201  Full code  Continuous     04/29/16 2200    Code Status History    Date Active Date Inactive Code Status Order ID Comments User Context   03/23/2016 10:05 PM 03/30/2016  6:19 PM Full Code [id number]  Leeroy Cha, MD Inpatient   02/06/2016 10:21 AM 02/07/2016  3:18 AM Full Code [id number]  Logan Bores, MD HOV    Advance Directive Documentation   Flowsheet Row Most Recent Value  Type  of Advance Directive  Healthcare Power of Attorney, Living will  Pre-existing out of facility DNR order (yellow form or pink MOST form)  No data  "MOST" Form in Place?  No data      TOTAL TIME TAKING CARE OF THIS PATIENT: 36 minutes.    Demetrios Loll M.D on 04/30/2016 at 2:33 PM  Between 7am to 6pm - Pager - [phone]  After 6pm go to [email or web address] - Proofreader  [facility]  Office  [phone]  CC: Primary care physician; Adin Hector, MD   Note: This dictation was prepared with Dragon dictation along with smaller phrase technology. Any transcriptional errors that result from this process are unintentional.

## 2016-04-30 NOTE — Discharge Instructions (Signed)
Heart healthy diet

## 2016-04-30 NOTE — Care Management (Addendum)
Patient admitted with PICC line in place.  Open with [facility] for RN and IV antibiotics.  Per Dr. Ola Spurr ID MD, PICC line to be removed today, and transitioned to PO antibiotic.  Corene Cornea with [facility] notified.  RNCM signing off.

## 2016-04-30 NOTE — Consult Note (Signed)
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  He has had a complicated course over last 6 weeks since he had an anterior discectomy done about 6 weeks ago. This was complicated by infected hematoma in the anterior neck region causing compression of his pharynx and esophagus and larynx. It was drained emergently on 03/23/2016 with infection down to hardware and also had MSSA bacteremia. PICC line was placed and discharged on Ancef and rifampin. TEE done at the time did not show any endocarditis. He does have a PPM. He has been tolerating the abx and had no neck pain but about a week ago developed fevers and body aches and assumed it was the flu as he works as a Engineer, drilling.  He took tamiflu but sxs worsened and developed rash.  Admitted and temp 100, wbc 7, LFTS elevated.Marland Kitchen   BCX have been neg. Stopped ancef and rifampin and doing much better   Past Medical History:  Diagnosis Date  . Arrhythmia   . Cancer ([place])   . Cardiac pacemaker   . H/O basal cell carcinoma excision   . History of basal cell carcinoma   . Hyperlipidemia   . Internal hemorrhoids   . Lumbar disc disease   . Presence of permanent cardiac pacemaker    medtronic Oct 2010  . PVC (premature ventricular contraction)   . Sinus node dysfunction [facility])    Past Surgical History:  Procedure Laterality Date  . ANAL FISSURE REPAIR WITH PARTIAL LATERAL SPHINCTEROTOMY  1998  . BACK SURGERY  12/18/2002  . CHOLECYSTECTOMY  01/2003  . COLONOSCOPY  10/19/2012  . ESOPHAGOSCOPY N/A 03/23/2016   Procedure: ESOPHAGOSCOPY;  Surgeon: Izora Gala, MD;  Location: [facility];  Service: ENT;  Laterality: N/A;  . INCISION AND DRAINAGE PERIRECTAL ABSCESS  11/2003  . INSERT / REPLACE /  REMOVE PACEMAKER    . INSERT/REPLACE/REMOVE PACEMAKER  01/24/2009  . NECK SURGERY    . TEE WITHOUT CARDIOVERSION N/A 03/30/2016   Procedure: TRANSESOPHAGEAL ECHOCARDIOGRAM (TEE);  Surgeon: Sanda Klein, MD;  Location: [facility] ENDOSCOPY;  Service: Cardiovascular;  Laterality: N/A;  . WOUND EXPLORATION N/A 03/23/2016   Procedure: Incision and Drainage of Cervical Wound;  Surgeon: Leeroy Cha, MD;  Location: [facility];  Service: Neurosurgery;  Laterality: N/A;   Social History  Substance Use Topics  . Smoking status: Never Smoker  . Smokeless tobacco: Never Used  . Alcohol use Yes   Family History  Problem Relation Age of Onset  . Sudden death Father   . Diabetes type II Sister     Allergies: No Known Allergies  Current antibiotics: Antibiotics Given (last 72 hours)    None      MEDICATIONS: . aspirin EC  81 mg Oral Daily  . doxycycline  100 mg Oral Q12H  . enoxaparin (LOVENOX) injection  40 mg Subcutaneous Q24H  . fluticasone  1 spray Each Nare Daily    Review of Systems - 11 systems reviewed and negative per HPI   OBJECTIVE: Temp:  [98.2 F (36.8 C)-100.4 F (38 C)] 98.3 F (36.8 C) (02/02 1108) Pulse Rate:  [65-107] 66 (02/02 1108) Resp:  [14-25] 18 (02/02 0412) BP: (104-125)/(58-79) 110/59 (02/02 1108)  SpO2:  [92 %-100 %] 96 % (02/02 1108) Weight:  [97.5 kg (215 lb)-105.2 kg (231 lb 14.4 oz)] 105.2 kg (231 lb 14.4 oz) (02/01 2203) Physical Exam  Constitutional: He is oriented to person, place, and time. He appears well-developed and well-nourished. No distress.  HENT: healed scar over ant neck  Mouth/Throat: Oropharynx is clear and moist. No oropharyngeal exudate.  Cardiovascular: Normal rate, regular rhythm and normal heart sounds. Exam reveals no gallop and no friction rub.  No murmur heard.  Ppm site wnl Pulmonary/Chest: Effort normal and breath sounds normal. No respiratory distress. He has no wheezes.  Abdominal: Soft. Bowel sounds are normal. He exhibits no  distension. There is no tenderness.  Lymphadenopathy:  He has no cervical adenopathy.  Neurological: He is alert and oriented to person, place, and time.  Skin: Skin is warm and dry. No rash noted. No erythema.  Psychiatric: He has a normal mood and affect. His behavior is normal.     LABS: Results for orders placed or performed during the hospital encounter of 04/29/16 (from the past 48 hour(s))  Lactic acid, plasma     Status: None   Collection Time: 04/29/16  4:54 PM  Result Value Ref Range   Lactic Acid, Venous 1.1 0.5 - 1.9 mmol/L  Comprehensive metabolic panel     Status: Abnormal   Collection Time: 04/29/16  4:54 PM  Result Value Ref Range   Sodium 134 (L) 135 - 145 mmol/L   Potassium 3.9 3.5 - 5.1 mmol/L   Chloride 101 101 - 111 mmol/L   CO2 24 22 - 32 mmol/L   Glucose, Bld 119 (H) 65 - 99 mg/dL   BUN 8 6 - 20 mg/dL   Creatinine, Ser 0.90 0.61 - 1.24 mg/dL   Calcium 8.5 (L) 8.9 - 10.3 mg/dL   Total Protein 7.2 6.5 - 8.1 g/dL   Albumin 4.0 3.5 - 5.0 g/dL   AST 223 (H) 15 - 41 U/L   ALT 191 (H) 17 - 63 U/L   Alkaline Phosphatase 202 (H) 38 - 126 U/L   Total Bilirubin 1.5 (H) 0.3 - 1.2 mg/dL   GFR calc non Af Amer >60 >60 mL/min   GFR calc Af Amer >60 >60 mL/min    Comment: (NOTE) The eGFR has been calculated using the CKD EPI equation. This calculation has not been validated in all clinical situations. eGFR's persistently <60 mL/min signify possible Chronic Kidney Disease.    Anion gap 9 5 - 15  CBC with Differential     Status: Abnormal   Collection Time: 04/29/16  4:54 PM  Result Value Ref Range   WBC 7.1 3.8 - 10.6 K/uL   RBC 5.33 4.40 - 5.90 MIL/uL   Hemoglobin 15.3 13.0 - 18.0 g/dL   HCT 45.0 40.0 - 52.0 %   MCV 84.4 80.0 - 100.0 fL   MCH 28.7 26.0 - 34.0 pg   MCHC 34.0 32.0 - 36.0 g/dL   RDW 13.7 11.5 - 14.5 %   Platelets 104 (L) 150 - 440 K/uL   Neutrophils Relative % 68 %   Lymphocytes Relative 18 %   Monocytes Relative 9 %   Eosinophils Relative  3 %   Basophils Relative 2 %   Band Neutrophils 0 %   Metamyelocytes Relative 0 %   Myelocytes 0 %   Promyelocytes Absolute 0 %   Blasts 0 %   nRBC 0 0 /100 WBC   Other 0 %   Neutro  Abs 4.9 1.4 - 6.5 K/uL   Lymphs Abs 1.3 1.0 - 3.6 K/uL   Monocytes Absolute 0.6 0.2 - 1.0 K/uL   Eosinophils Absolute 0.2 0 - 0.7 K/uL   Basophils Absolute 0.1 0 - 0.1 K/uL   WBC Morphology ATYPICAL LYMPHOCYTES    Smear Review MORPHOLOGY UNREMARKABLE   Urinalysis, Complete w Microscopic     Status: Abnormal   Collection Time: 04/29/16  4:54 PM  Result Value Ref Range   Color, Urine AMBER (A) YELLOW    Comment: BIOCHEMICALS MAY BE AFFECTED BY COLOR   APPearance CLEAR (A) CLEAR   Specific Gravity, Urine 1.015 1.005 - 1.030   pH 6.0 5.0 - 8.0   Glucose, UA NEGATIVE NEGATIVE mg/dL   Hgb urine dipstick NEGATIVE NEGATIVE   Bilirubin Urine NEGATIVE NEGATIVE   Ketones, ur 5 (A) NEGATIVE mg/dL   Protein, ur 30 (A) NEGATIVE mg/dL   Nitrite NEGATIVE NEGATIVE   Leukocytes, UA NEGATIVE NEGATIVE   RBC / HPF 0-5 0 - 5 RBC/hpf   WBC, UA 0-5 0 - 5 WBC/hpf   Bacteria, UA NONE SEEN NONE SEEN   Squamous Epithelial / LPF NONE SEEN NONE SEEN   Mucous PRESENT   CK     Status: Abnormal   Collection Time: 04/29/16  4:54 PM  Result Value Ref Range   Total CK 44 (L) 49 - 397 U/L  Culture, blood (routine x 2)     Status: None (Preliminary result)   Collection Time: 04/29/16  6:52 PM  Result Value Ref Range   Specimen Description BLOOD  LEFT ARM     Special Requests      BOTTLES DRAWN AEROBIC AND ANAEROBIC  AER 11 ML ANA 12 ML   Culture NO GROWTH < 12 HOURS    Report Status PENDING   Culture, blood (routine x 2)     Status: None (Preliminary result)   Collection Time: 04/29/16  6:52 PM  Result Value Ref Range   Specimen Description BLOOD  RIGHT ARM    Special Requests      BOTTLES DRAWN AEROBIC AND ANAEROBIC  AER 11 ML ANA 10 ML   Culture NO GROWTH < 12 HOURS    Report Status PENDING   Comprehensive metabolic  panel     Status: Abnormal   Collection Time: 04/30/16  5:46 AM  Result Value Ref Range   Sodium 136 135 - 145 mmol/L   Potassium 3.9 3.5 - 5.1 mmol/L   Chloride 104 101 - 111 mmol/L   CO2 26 22 - 32 mmol/L   Glucose, Bld 105 (H) 65 - 99 mg/dL   BUN 9 6 - 20 mg/dL   Creatinine, Ser 0.82 0.61 - 1.24 mg/dL   Calcium 8.1 (L) 8.9 - 10.3 mg/dL   Total Protein 6.1 (L) 6.5 - 8.1 g/dL   Albumin 3.4 (L) 3.5 - 5.0 g/dL   AST 188 (H) 15 - 41 U/L   ALT 184 (H) 17 - 63 U/L   Alkaline Phosphatase 184 (H) 38 - 126 U/L   Total Bilirubin 1.3 (H) 0.3 - 1.2 mg/dL   GFR calc non Af Amer >60 >60 mL/min   GFR calc Af Amer >60 >60 mL/min    Comment: (NOTE) The eGFR has been calculated using the CKD EPI equation. This calculation has not been validated in all clinical situations. eGFR's persistently <60 mL/min signify possible Chronic Kidney Disease.    Anion gap 6 5 - 15  CBC  Status: Abnormal   Collection Time: 04/30/16  5:46 AM  Result Value Ref Range   WBC 5.9 3.8 - 10.6 K/uL   RBC 4.71 4.40 - 5.90 MIL/uL   Hemoglobin 13.6 13.0 - 18.0 g/dL   HCT 39.7 (L) 40.0 - 52.0 %   MCV 84.4 80.0 - 100.0 fL   MCH 28.9 26.0 - 34.0 pg   MCHC 34.3 32.0 - 36.0 g/dL   RDW 13.7 11.5 - 14.5 %   Platelets 105 (L) 150 - 440 K/uL   No components found for: ESR, C REACTIVE PROTEIN MICRO: Recent Results (from the past 720 hour(s))  Culture, blood (routine x 2)     Status: None (Preliminary result)   Collection Time: 04/29/16  6:52 PM  Result Value Ref Range Status   Specimen Description BLOOD  LEFT ARM   Final   Special Requests   Final    BOTTLES DRAWN AEROBIC AND ANAEROBIC  AER 11 ML ANA 12 ML   Culture NO GROWTH < 12 HOURS  Final   Report Status PENDING  Incomplete  Culture, blood (routine x 2)     Status: None (Preliminary result)   Collection Time: 04/29/16  6:52 PM  Result Value Ref Range Status   Specimen Description BLOOD  RIGHT ARM  Final   Special Requests   Final    BOTTLES DRAWN AEROBIC AND  ANAEROBIC  AER 11 ML ANA 10 ML   Culture NO GROWTH < 12 HOURS  Final   Report Status PENDING  Incomplete    IMAGING: Dg Chest Portable 1 View  Result Date: 04/29/2016 CLINICAL DATA:  Recurrent next abscess. EXAM: PORTABLE CHEST 1 VIEW COMPARISON:  None. FINDINGS: Dual lead cardiac pacemaker in satisfactory radiographic position. Right-sided PICC line terminates at the cavoatrial junction. Cardiomediastinal silhouette is normal. Mediastinal contours appear intact. There is no evidence of focal airspace consolidation, pleural effusion or pneumothorax. Low lung volumes. Osseous structures are without acute abnormality. Soft tissues are grossly normal. IMPRESSION: No active disease. Electronically Signed   By: Fidela Salisbury M.D.   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  He has had a complicated course over last 6 weeks since he had an anterior discectomy done about 6 weeks ago. This was complicated by infected hematoma in the anterior neck region causing compression of his pharynx and esophagus and larynx. It was drained emergently on 03/23/2016 with infection down to hardware and also had MSSA bacteremia. PICC line was placed and discharged on Ancef and rifampin. TEE done at the time did not show any endocarditis. He does have a PPM. He is admitted now with what appears to be a drug reaction with fevers, rash and elevated LFTs.  Held abx and feels better.  I do think this is a serum sickness type reaction likely to rifampin.   Recommendations DC PICC DC on oral doxycycline 100 mg bid - he will likely need prolonged suppressive therapy since hardware likely infeced Discussed with Dr Johnnye Sima who will see patient next week  Thank you very much for allowing me to participate in the care of this patient. Please call with questions.   Cheral Marker. Ola Spurr, MD

## 2016-04-30 NOTE — Progress Notes (Signed)
MD ordered patient to be discharged home.  Discharge instructions were reviewed with the patient and he voiced understanding.  Follow-up appointments were made.  Prescriptions given to the patient.  PICC line removed, occlusive dressing placed, pressure held for 5 mins, patient tolerated it well.  PICC dressing still clean dry and intact.  All patients questions were answered.  Patient leaving via wheelchair escorted by nursing.

## 2016-04-30 NOTE — Telephone Encounter (Signed)
Pt called, went to ED, now in hospital

## 2016-05-01 LAB — URINE CULTURE: Culture: NO GROWTH

## 2016-05-04 ENCOUNTER — Encounter: Payer: Self-pay | Admitting: Infectious Diseases

## 2016-05-04 ENCOUNTER — Ambulatory Visit (INDEPENDENT_AMBULATORY_CARE_PROVIDER_SITE_OTHER): Payer: BC Managed Care – PPO | Admitting: Infectious Diseases

## 2016-05-04 VITALS — BP 101/67 | HR 64 | Ht 74.0 in | Wt 225.0 lb

## 2016-05-04 DIAGNOSIS — L0291 Cutaneous abscess, unspecified: Secondary | ICD-10-CM

## 2016-05-04 DIAGNOSIS — R502 Drug induced fever: Secondary | ICD-10-CM

## 2016-05-04 DIAGNOSIS — R7881 Bacteremia: Secondary | ICD-10-CM

## 2016-05-04 DIAGNOSIS — B9561 Methicillin susceptible Staphylococcus aureus infection as the cause of diseases classified elsewhere: Secondary | ICD-10-CM

## 2016-05-04 LAB — COMPREHENSIVE METABOLIC PANEL
ALT: 127 U/L — ABNORMAL HIGH (ref 9–46)
AST: 50 U/L — ABNORMAL HIGH (ref 10–35)
Albumin: 4 g/dL (ref 3.6–5.1)
Alkaline Phosphatase: 189 U/L — ABNORMAL HIGH (ref 40–115)
BUN: 11 mg/dL (ref 7–25)
CO2: 28 mmol/L (ref 20–31)
Calcium: 9 mg/dL (ref 8.6–10.3)
Chloride: 104 mmol/L (ref 98–110)
Creat: 0.92 mg/dL (ref 0.70–1.25)
Glucose, Bld: 105 mg/dL — ABNORMAL HIGH (ref 65–99)
Potassium: 4.4 mmol/L (ref 3.5–5.3)
Sodium: 139 mmol/L (ref 135–146)
Total Bilirubin: 0.6 mg/dL (ref 0.2–1.2)
Total Protein: 6.5 g/dL (ref 6.1–8.1)

## 2016-05-04 LAB — CBC
HCT: 42.4 % (ref 38.5–50.0)
Hemoglobin: 14.1 g/dL (ref 13.2–17.1)
MCH: 28.5 pg (ref 27.0–33.0)
MCHC: 33.3 g/dL (ref 32.0–36.0)
MCV: 85.7 fL (ref 80.0–100.0)
MPV: 10 fL (ref 7.5–12.5)
Platelets: 238 10*3/uL (ref 140–400)
RBC: 4.95 MIL/uL (ref 4.20–5.80)
RDW: 14.3 % (ref 11.0–15.0)
WBC: 8.9 10*3/uL (ref 3.8–10.8)

## 2016-05-04 LAB — CULTURE, BLOOD (ROUTINE X 2)
Culture: NO GROWTH
Culture: NO GROWTH

## 2016-05-04 NOTE — Assessment & Plan Note (Signed)
Much better.  Rifampin added to allergies list.  Also possible cefazolin.

## 2016-05-04 NOTE — Assessment & Plan Note (Signed)
Has had multiple negative repeat Cx.  Will hold on repeating his BCx at this time.

## 2016-05-04 NOTE — Progress Notes (Signed)
   Subjective:    Patient ID: [name], MD, male    DOB: June 27, 1955, 61 y.o.   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 Since the procedure he has had numbness in his hands, dysphagia. By 12-25 he developed rigors. He returned to hospital on 12-26 with temp 101 and worsening dysphagia to the point he was unable to swallow liquids.  He was found to have  cervical abscess and had debridement 12-26. His hardware was retained.  He also had positive BCx for staph aureus. He was treated with ancef/rifampin. His repeat BCx 12-27 were (-). He had TEE (-) on 1-2 and was d/c home on 1-2.  By 1-30 he began to haven difficulty with rash, fever, and anorexia. He had LFTs done on 2-1 which showed potassium of 6.2, Alk P 164, AST 157, ALT 105. His anbx were stopped. He went to [facility] where he was adm overnight. His anbx were changed to doxy '100mg'$  bid for a planned 28 days. His AST/ALT were 188/184, Alk P 184 on day of d/c. He had repeat BCx on 2-1 that are negative.   Confounding this was that he also thought he had the flu at that time. He took a 9/10 course of tamiflu around the time of his illness. There were multiple other employees out at that time with flu as well.   He feels much better now.  PIC line is out.   Review of Systems  Constitutional: Negative for chills and fever.  Gastrointestinal: Negative for constipation and diarrhea.  Genitourinary: Negative for difficulty urinating.  Musculoskeletal: Negative for arthralgias.  Skin: Positive for rash.  has had some episodes of sweats, decreasing in frequency.      12-27 Sed rate  41 CRP  13.4    Objective:   Physical Exam  Constitutional: He appears well-developed and well-nourished.  HENT:  Mouth/Throat: No oropharyngeal exudate.  Eyes: EOM are normal. Pupils are equal, round, and reactive to light.  Neck: Neck supple.    Cardiovascular: Normal rate,  regular rhythm and normal heart sounds.   Pulmonary/Chest: Effort normal and breath sounds normal.  Abdominal: Soft. Bowel sounds are normal. There is no tenderness. There is no rebound.  Musculoskeletal: He exhibits no edema.  Lymphadenopathy:    He has no cervical adenopathy.  Skin: Rash noted.  Faint on chest.       Assessment & Plan:

## 2016-05-04 NOTE — Assessment & Plan Note (Signed)
He appears to be doing very well.  Will recheck his ESR and CRP to see if he needs chronic anbx. We discussed that his likelihood of cure is good given that his infection occurred early.  Will call him with results.  Will recheck his LFTs today as well.

## 2016-05-05 ENCOUNTER — Telehealth: Payer: Self-pay | Admitting: Infectious Diseases

## 2016-05-05 ENCOUNTER — Encounter: Payer: Self-pay | Admitting: Cardiology

## 2016-05-05 LAB — SEDIMENTATION RATE: Sed Rate: 5 mm/hr (ref 0–20)

## 2016-05-05 LAB — C-REACTIVE PROTEIN: CRP: 2.8 mg/L (ref <8.0)

## 2016-05-05 NOTE — Telephone Encounter (Signed)
called pt with regard to his labs: ESR and CRP are normal AST 50 and ALT 127 (both better).  Would repeat his labs 2-4 weeks after off anbx.

## 2016-05-08 LAB — CUP PACEART REMOTE DEVICE CHECK
Battery Impedance: 1271 Ohm
Battery Remaining Longevity: 59 mo
Battery Voltage: 2.77 V
Brady Statistic AP VP Percent: 0 %
Brady Statistic AP VS Percent: 4 %
Brady Statistic AS VP Percent: 0 %
Brady Statistic AS VS Percent: 96 %
Date Time Interrogation Session: 20180201171404
Implantable Lead Implant Date: 20101029
Implantable Lead Implant Date: 20101029
Implantable Lead Location: 753859
Implantable Lead Location: 753860
Implantable Lead Model: 5076
Implantable Lead Model: 5076
Implantable Pulse Generator Implant Date: 20101029
Lead Channel Impedance Value: 413 Ohm
Lead Channel Impedance Value: 442 Ohm
Lead Channel Pacing Threshold Amplitude: 0.5 V
Lead Channel Pacing Threshold Amplitude: 0.5 V
Lead Channel Pacing Threshold Pulse Width: 0.4 ms
Lead Channel Pacing Threshold Pulse Width: 0.4 ms
Lead Channel Setting Pacing Amplitude: 1.5 V
Lead Channel Setting Pacing Amplitude: 2 V
Lead Channel Setting Pacing Pulse Width: 0.4 ms
Lead Channel Setting Sensing Sensitivity: 2 mV

## 2016-05-19 ENCOUNTER — Encounter: Payer: Self-pay | Admitting: Cardiology

## 2016-07-27 ENCOUNTER — Encounter: Payer: Self-pay | Admitting: Internal Medicine

## 2016-07-27 NOTE — Telephone Encounter (Signed)
Spoke to patient about remote transmission. Patient states that he sends remotes q73months. 5/3 remote rescheduled to 8/6. Patient made aware of date.

## 2016-10-31 ENCOUNTER — Encounter: Payer: Self-pay | Admitting: Internal Medicine

## 2016-11-01 ENCOUNTER — Ambulatory Visit (INDEPENDENT_AMBULATORY_CARE_PROVIDER_SITE_OTHER): Payer: BC Managed Care – PPO | Admitting: *Deleted

## 2016-11-01 DIAGNOSIS — I495 Sick sinus syndrome: Secondary | ICD-10-CM

## 2016-11-02 NOTE — Progress Notes (Signed)
Remote pacemaker transmission.   

## 2016-11-03 ENCOUNTER — Encounter: Payer: Self-pay | Admitting: Cardiology

## 2016-11-18 LAB — CUP PACEART REMOTE DEVICE CHECK
Battery Impedance: 1524 Ohm
Battery Remaining Longevity: 51 mo
Battery Voltage: 2.77 V
Brady Statistic AP VP Percent: 0 %
Brady Statistic AP VS Percent: 6 %
Brady Statistic AS VP Percent: 0 %
Brady Statistic AS VS Percent: 93 %
Date Time Interrogation Session: 20180805142337
Implantable Lead Implant Date: 20101029
Implantable Lead Implant Date: 20101029
Implantable Lead Location: 753859
Implantable Lead Location: 753860
Implantable Lead Model: 5076
Implantable Lead Model: 5076
Implantable Pulse Generator Implant Date: 20101029
Lead Channel Impedance Value: 433 Ohm
Lead Channel Impedance Value: 449 Ohm
Lead Channel Pacing Threshold Amplitude: 0.5 V
Lead Channel Pacing Threshold Amplitude: 0.5 V
Lead Channel Pacing Threshold Pulse Width: 0.4 ms
Lead Channel Pacing Threshold Pulse Width: 0.4 ms
Lead Channel Setting Pacing Amplitude: 1.5 V
Lead Channel Setting Pacing Amplitude: 2 V
Lead Channel Setting Pacing Pulse Width: 0.4 ms
Lead Channel Setting Sensing Sensitivity: 2.8 mV

## 2017-01-28 ENCOUNTER — Encounter: Payer: Self-pay | Admitting: Internal Medicine

## 2017-01-31 ENCOUNTER — Encounter: Payer: BC Managed Care – PPO | Admitting: *Deleted

## 2017-02-04 ENCOUNTER — Encounter: Payer: Self-pay | Admitting: Cardiology

## 2017-02-11 ENCOUNTER — Ambulatory Visit: Payer: BC Managed Care – PPO | Admitting: Internal Medicine

## 2017-02-11 ENCOUNTER — Encounter: Payer: Self-pay | Admitting: Internal Medicine

## 2017-02-11 VITALS — BP 124/68 | HR 81 | Wt 240.0 lb

## 2017-02-11 DIAGNOSIS — Z950 Presence of cardiac pacemaker: Secondary | ICD-10-CM

## 2017-02-11 DIAGNOSIS — I495 Sick sinus syndrome: Secondary | ICD-10-CM | POA: Diagnosis not present

## 2017-02-11 LAB — CUP PACEART INCLINIC DEVICE CHECK
Battery Impedance: 1608 Ohm
Battery Remaining Longevity: 48 mo
Battery Voltage: 2.76 V
Brady Statistic AP VP Percent: 0 %
Brady Statistic AP VS Percent: 7 %
Brady Statistic AS VP Percent: 0 %
Brady Statistic AS VS Percent: 92 %
Date Time Interrogation Session: 20181116165631
Implantable Lead Implant Date: 20101029
Implantable Lead Implant Date: 20101029
Implantable Lead Location: 753859
Implantable Lead Location: 753860
Implantable Lead Model: 5076
Implantable Lead Model: 5076
Implantable Pulse Generator Implant Date: 20101029
Lead Channel Impedance Value: 425 Ohm
Lead Channel Impedance Value: 461 Ohm
Lead Channel Pacing Threshold Amplitude: 0.5 V
Lead Channel Pacing Threshold Amplitude: 0.5 V
Lead Channel Pacing Threshold Pulse Width: 0.4 ms
Lead Channel Pacing Threshold Pulse Width: 0.4 ms
Lead Channel Sensing Intrinsic Amplitude: 11.2 mV
Lead Channel Sensing Intrinsic Amplitude: 2 mV
Lead Channel Setting Pacing Amplitude: 2 V
Lead Channel Setting Pacing Amplitude: 2 V
Lead Channel Setting Pacing Pulse Width: 0.4 ms
Lead Channel Setting Sensing Sensitivity: 4 mV

## 2017-02-11 NOTE — Patient Instructions (Signed)
Your physician recommends that you continue on your current medications as directed. Please refer to the Current Medication list given to you today.   Your physician wants you to follow-up in:   [place] will receive a reminder letter in the mail two months in advance. If you don't receive a letter, please call our office to schedule the follow-up appointment.

## 2017-02-11 NOTE — Progress Notes (Signed)
      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  He has a history of nocturnal sleep associated sinus node dysfunction for which he underwent pacemaker implantation 10/10.   He also has a history of symptomatic PVCs which have been ameliorated by the pacing.  He denies a history of sleep apnea. He does not have hypertension. He denies problems with exercise tolerance. He has no chest pain nocturnal dyspnea or peripheral edema. He has had no tachypalpitations.  He has a history of symptomatic PVCs as well as device detected atrial arrhythmia.  He underwent a neck fusion procedure about a year ago.  This was complicated by MSSA hematoma infection as well as bacteremia.  TEE was negative  Currently denies chest pain shortness of breath.  Records and Results Reviewed  Outside labs 10./17 normal CBC TSH BMET  Past Medical History:  Diagnosis Date  . Arrhythmia   . Cancer ([place])   . Cardiac pacemaker   . H/O basal cell carcinoma excision   . History of basal cell carcinoma   . Hyperlipidemia   . Internal hemorrhoids   . Lumbar disc disease   . Presence of permanent cardiac pacemaker    medtronic Oct 2010  . PVC (premature ventricular contraction)   . Sinus node dysfunction [facility])     Past Surgical History:  Procedure Laterality Date  . ANAL FISSURE REPAIR WITH PARTIAL LATERAL SPHINCTEROTOMY  1998  . BACK SURGERY  12/18/2002  . CHOLECYSTECTOMY  01/2003  . COLONOSCOPY  10/19/2012  . ESOPHAGOSCOPY N/A 03/23/2016   Performed by Izora Gala, MD at [place]  . Incision and Drainage of Cervical Wound N/A 03/23/2016   Performed by Leeroy Cha, MD at [facility]  . INCISION AND DRAINAGE PERIRECTAL ABSCESS  11/2003  . INSERT / REPLACE / REMOVE PACEMAKER    . INSERT/REPLACE/REMOVE PACEMAKER  01/24/2009  . NECK SURGERY    . TRANSESOPHAGEAL ECHOCARDIOGRAM (TEE) N/A 03/30/2016     Performed by Sanda Saeed Toren, MD at [facility] ENDOSCOPY    Current Outpatient Medications  Medication Sig Dispense Refill  . aspirin EC 81 MG tablet Take 81 mg by mouth daily.     No current facility-administered medications for this visit.     Allergies  Allergen Reactions  . Rifampin Rash    And hepatitis      Review of Systems negative except from HPI and PMH  Physical Exam BP 124/68   Pulse 81   Wt 240 lb (108.9 kg)   BMI 30.81 kg/m  Well developed and nourished in no acute distress HENT normal Neck supple with JVP-flat Clear Regular rate and rhythm, no murmurs or gallops Abd-soft with active BS No Clubbing cyanosis edema Skin-warm and dry A & Oriented  Grossly normal sensory and motor function    ECG personally reviewed Sinus @ 76 16/08/36  Assessment and  Plan  SINUS NODE Dysfunction   Medtronic pacemaker  [name] device was interrogated.  The information was reviewed. No changes were made in the programming.     Infrequent atrial pacing.  Thankfully, he dodged a bullet related to his MSSA bacteremia in his device.  I last discussed with Dr. Caryl Comes the role of aspirin  Current medicines are reviewed at length with the patient today .  The patient does not  have concerns regarding medicines.

## 2017-05-12 ENCOUNTER — Ambulatory Visit (INDEPENDENT_AMBULATORY_CARE_PROVIDER_SITE_OTHER): Payer: Self-pay | Admitting: *Deleted

## 2017-05-12 DIAGNOSIS — I495 Sick sinus syndrome: Secondary | ICD-10-CM

## 2017-05-13 ENCOUNTER — Encounter: Payer: Self-pay | Admitting: Internal Medicine

## 2017-06-20 NOTE — Progress Notes (Signed)
Remote pacemaker transmission.   

## 2017-08-19 ENCOUNTER — Telehealth: Payer: Self-pay | Admitting: Cardiology

## 2017-08-19 ENCOUNTER — Ambulatory Visit (INDEPENDENT_AMBULATORY_CARE_PROVIDER_SITE_OTHER): Payer: BC Managed Care – PPO | Admitting: *Deleted

## 2017-08-19 DIAGNOSIS — I495 Sick sinus syndrome: Secondary | ICD-10-CM

## 2017-08-19 NOTE — Telephone Encounter (Signed)
LMOVM reminding pt to send remote transmission.   

## 2017-08-23 ENCOUNTER — Encounter: Payer: Self-pay | Admitting: Cardiology

## 2017-08-23 NOTE — Progress Notes (Signed)
Remote pacemaker transmission.   

## 2017-09-03 ENCOUNTER — Other Ambulatory Visit
Admission: RE | Admit: 2017-09-03 | Discharge: 2017-09-03 | Disposition: A | Discharge status: Home / Self Care | Payer: BC Managed Care – PPO | Source: Ambulatory Visit | Attending: Internal Medicine | Admitting: Internal Medicine

## 2017-09-03 DIAGNOSIS — R197 Diarrhea, unspecified: Secondary | ICD-10-CM | POA: Insufficient documentation

## 2017-09-03 LAB — GASTROINTESTINAL PANEL BY PCR, STOOL (REPLACES STOOL CULTURE)

## 2017-09-03 LAB — C DIFFICILE QUICK SCREEN W PCR REFLEX
C Diff antigen: NEGATIVE
C Diff interpretation: NOT DETECTED
C Diff toxin: NEGATIVE

## 2017-09-06 ENCOUNTER — Telehealth: Payer: Self-pay

## 2017-09-06 ENCOUNTER — Telehealth: Payer: Self-pay | Admitting: Internal Medicine

## 2017-09-06 NOTE — Telephone Encounter (Signed)
LVM for pt to call back in reference to surgical clearance prior to colonoscopy. He is followed primarily for SSS s/p PPM followed by Dr. Caryl Comes. There is no documentation of coronary artery disease. If he is not having any cardiac symptoms when he returns phone call, then he can likely be cleared for colonoscopy.

## 2017-09-06 NOTE — Telephone Encounter (Signed)
   [facility] Pre-operative Risk Assessment    Request for surgical clearance:  1. What type of surgery is being performed? Colonoscopy  2. When is this surgery scheduled? 11/25/17   3. What type of clearance is required (medical clearance vs. Pharmacy clearance to hold med vs. Both)? Medical and pharmacy       Are there any medications that need       to be held prior to surgery and how        Long? Aspirin morning of                                   procedure   4. Practice name and name of physician performing surgery? [facility]   5. What is your office phone number [phone]    7.   What is your office fax number        [phone]  8.   Anesthesia type (None, local, MAC, general) ?

## 2017-09-06 NOTE — Telephone Encounter (Signed)
Follow Up:     Pt says he was returning your call.

## 2017-09-07 LAB — CALPROTECTIN, FECAL: Calprotectin, Fecal: <16 ug/g (ref 0–120)

## 2017-09-08 NOTE — Telephone Encounter (Signed)
   Primary Cardiologist: Virl Axe, MD  Chart reviewed as part of pre-operative protocol coverage. Patient was contacted 09/08/2017 in reference to pre-operative risk assessment for pending surgery as outlined below.  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 Caryl Comes .  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 Easily getting > 4 mets of activity.   Therefore, based on ACC/AHA guidelines, the patient would be at acceptable risk for the planned procedure without further cardiovascular testing.   Dr. Caryl Comes, is it okay to hold ASA and how long? Please forward your response to P CV DIV PREOP. Thank you   Leanor Kail, PA 09/08/2017, 2:24 PM

## 2017-09-13 LAB — CUP PACEART REMOTE DEVICE CHECK
Battery Impedance: 1942 Ohm
Battery Remaining Longevity: 40 mo
Battery Voltage: 2.77 V
Brady Statistic AP VP Percent: 0 %
Brady Statistic AP VS Percent: 10 %
Brady Statistic AS VP Percent: 1 %
Brady Statistic AS VS Percent: 90 %
Date Time Interrogation Session: 20190524215138
Implantable Lead Implant Date: 20101029
Implantable Lead Implant Date: 20101029
Implantable Lead Location: 753859
Implantable Lead Location: 753860
Implantable Lead Model: 5076
Implantable Lead Model: 5076
Implantable Pulse Generator Implant Date: 20101029
Lead Channel Impedance Value: 434 Ohm
Lead Channel Impedance Value: 437 Ohm
Lead Channel Pacing Threshold Amplitude: 0.5 V
Lead Channel Pacing Threshold Amplitude: 0.5 V
Lead Channel Pacing Threshold Pulse Width: 0.4 ms
Lead Channel Pacing Threshold Pulse Width: 0.4 ms
Lead Channel Setting Pacing Amplitude: 2 V
Lead Channel Setting Pacing Amplitude: 2 V
Lead Channel Setting Pacing Pulse Width: 0.4 ms
Lead Channel Setting Sensing Sensitivity: 2 mV

## 2017-09-13 NOTE — Telephone Encounter (Signed)
Dr. Caryl Comes, is it okay to hold ASA and how long?  Please forward your response to PCV DIV PREOP. Thank you

## 2017-09-15 NOTE — Telephone Encounter (Signed)
yes

## 2017-09-15 NOTE — Telephone Encounter (Signed)
   Primary Cardiologist: Virl Axe, MD  Chart reviewed as part of pre-operative protocol coverage. Patient was contacted 09/08/2017 By Mr. Curly [place], [place] in reference to pre-operative risk assessment for pending surgery as outlined below.  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 Caryl Comes .  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 Easily getting > 4 mets of activity.   Therefore, based on ACC/AHA guidelines, the patient would be at acceptable risk for the planned procedure without further cardiovascular testing.   It is OK to hold aspirin as needed for the procedure.   I will route this recommendation to the requesting party via Epic fax function and remove from pre-op pool.  Please call with questions.  Daune Perch, NP 09/15/2017, 4:26 PM

## 2017-11-24 ENCOUNTER — Encounter: Payer: Self-pay | Admitting: *Deleted

## 2017-11-25 ENCOUNTER — Ambulatory Visit
Admission: RE | Admit: 2017-11-25 | Discharge: 2017-11-25 | Disposition: A | Discharge status: Home / Self Care | Payer: BC Managed Care – PPO | Source: Ambulatory Visit | Attending: Unknown Physician Specialty | Admitting: Unknown Physician Specialty

## 2017-11-25 ENCOUNTER — Ambulatory Visit: Payer: BC Managed Care – PPO | Admitting: Anesthesiology

## 2017-11-25 ENCOUNTER — Encounter
Admission: RE | Disposition: A | Discharge status: Home / Self Care | Payer: Self-pay | Source: Ambulatory Visit | Attending: Unknown Physician Specialty

## 2017-11-25 ENCOUNTER — Encounter: Payer: Self-pay | Admitting: Anesthesiology

## 2017-11-25 DIAGNOSIS — E785 Hyperlipidemia, unspecified: Secondary | ICD-10-CM | POA: Insufficient documentation

## 2017-11-25 DIAGNOSIS — K644 Residual hemorrhoidal skin tags: Secondary | ICD-10-CM | POA: Insufficient documentation

## 2017-11-25 DIAGNOSIS — Z1211 Encounter for screening for malignant neoplasm of colon: Secondary | ICD-10-CM | POA: Insufficient documentation

## 2017-11-25 DIAGNOSIS — Z888 Allergy status to other drugs, medicaments and biological substances status: Secondary | ICD-10-CM | POA: Insufficient documentation

## 2017-11-25 DIAGNOSIS — Z85828 Personal history of other malignant neoplasm of skin: Secondary | ICD-10-CM | POA: Insufficient documentation

## 2017-11-25 DIAGNOSIS — Z7982 Long term (current) use of aspirin: Secondary | ICD-10-CM | POA: Insufficient documentation

## 2017-11-25 DIAGNOSIS — K648 Other hemorrhoids: Secondary | ICD-10-CM | POA: Diagnosis not present

## 2017-11-25 DIAGNOSIS — I493 Ventricular premature depolarization: Secondary | ICD-10-CM | POA: Insufficient documentation

## 2017-11-25 DIAGNOSIS — Z86010 Personal history of colonic polyps: Secondary | ICD-10-CM | POA: Insufficient documentation

## 2017-11-25 DIAGNOSIS — K635 Polyp of colon: Secondary | ICD-10-CM | POA: Insufficient documentation

## 2017-11-25 DIAGNOSIS — D120 Benign neoplasm of cecum: Secondary | ICD-10-CM | POA: Diagnosis not present

## 2017-11-25 DIAGNOSIS — Z950 Presence of cardiac pacemaker: Secondary | ICD-10-CM | POA: Diagnosis not present

## 2017-11-25 DIAGNOSIS — Z79899 Other long term (current) drug therapy: Secondary | ICD-10-CM | POA: Insufficient documentation

## 2017-11-25 DIAGNOSIS — D122 Benign neoplasm of ascending colon: Secondary | ICD-10-CM | POA: Diagnosis not present

## 2017-11-25 HISTORY — DX: Other complications of anesthesia, initial encounter: T88.59XA

## 2017-11-25 HISTORY — PX: COLONOSCOPY WITH PROPOFOL: SHX5780

## 2017-11-25 HISTORY — DX: Basal cell carcinoma of skin of other parts of face: C44.319

## 2017-11-25 HISTORY — DX: Adverse effect of unspecified anesthetic, initial encounter: T41.45XA

## 2017-11-25 SURGERY — COLONOSCOPY WITH PROPOFOL
Anesthesia: General

## 2017-11-25 MED ORDER — SODIUM CHLORIDE 0.9 % IV SOLN
INTRAVENOUS | Status: DC
  Administered 2017-11-25: 1000 mL via INTRAVENOUS

## 2017-11-25 MED ORDER — PROPOFOL 500 MG/50ML IV EMUL
INTRAVENOUS | Status: DC | PRN
  Administered 2017-11-25: 140 ug/kg/min via INTRAVENOUS

## 2017-11-25 MED ORDER — LIDOCAINE HCL (CARDIAC) PF 100 MG/5ML IV SOSY
PREFILLED_SYRINGE | INTRAVENOUS | Status: DC | PRN
  Administered 2017-11-25: 50 mg via INTRAVENOUS

## 2017-11-25 MED ORDER — SODIUM CHLORIDE 0.9 % IV SOLN: INTRAVENOUS | Status: DC

## 2017-11-25 MED ORDER — PROPOFOL 500 MG/50ML IV EMUL
INTRAVENOUS | Status: AC
  Filled 2017-11-25: qty 50

## 2017-11-25 MED ORDER — PROPOFOL 10 MG/ML IV BOLUS
INTRAVENOUS | Status: DC | PRN
  Administered 2017-11-25: 100 mg via INTRAVENOUS

## 2017-11-25 MED ORDER — LIDOCAINE HCL (PF) 2 % IJ SOLN
INTRAMUSCULAR | Status: AC
  Filled 2017-11-25: qty 10

## 2017-11-25 NOTE — Anesthesia Postprocedure Evaluation (Signed)
Anesthesia Post Note  Patient: [name], MD  Procedure(s) Performed: COLONOSCOPY WITH PROPOFOL (N/A )  Patient location during evaluation: Endoscopy Anesthesia Type: General Level of consciousness: awake and alert Pain management: pain level controlled Vital Signs Assessment: post-procedure vital signs reviewed and stable Respiratory status: spontaneous breathing, nonlabored ventilation, respiratory function stable and patient connected to nasal cannula oxygen Cardiovascular status: blood pressure returned to baseline and stable Postop Assessment: no apparent nausea or vomiting Anesthetic complications: no     Last Vitals:  Vitals:   11/25/17 1037 11/25/17 1047  BP: (!) 87/48 (!) 102/59  Pulse: (!) 54 61  Resp: 15 17  Temp:    SpO2: 97% 97%    Last Pain:  Vitals:   11/25/17 1057  TempSrc:   PainSc: (P) 0-No pain                 Chiyoko Torrico S

## 2017-11-25 NOTE — Transfer of Care (Signed)
Immediate Anesthesia Transfer of Care Note  Patient: Bob Chiquito, MD  Procedure(s) Performed: COLONOSCOPY WITH PROPOFOL (N/A )  Patient Location: PACU and Endoscopy Unit  Anesthesia Type:General  Level of Consciousness: drowsy  Airway & Oxygen Therapy: Patient Spontanous Breathing and Patient connected to nasal cannula oxygen  Post-op Assessment: Report given to RN and Post -op Vital signs reviewed and stable  Post vital signs: Reviewed and stable  Last Vitals:  Vitals Value Taken Time  BP    Temp 36.3 C 11/25/2017 10:27 AM  Pulse 61 11/25/2017 10:27 AM  Resp 16 11/25/2017 10:27 AM  SpO2 96 % 11/25/2017 10:27 AM    Last Pain:  Vitals:   11/25/17 1027  TempSrc: Tympanic  PainSc: 0-No pain         Complications: No apparent anesthesia complications

## 2017-11-25 NOTE — Op Note (Signed)
[facility] Patient Name: Bob Schmidt Procedure Date: 11/25/2017 9:29 AM MRN: [id number] Account #: [id number] Date of Birth: January 14, 1956 Admit Type: Outpatient Age: 62 Room: [facility] ENDO ROOM 3 Gender: Male Note Status: Finalized Procedure:            Colonoscopy Indications:          High risk colon cancer surveillance: Personal history                        of colonic polyps Providers:            Manya Silvas, MD Referring MD:         Ramonita Lab, MD (Referring MD) Medicines:            Propofol per Anesthesia Complications:        No immediate complications. Procedure:            Pre-Anesthesia Assessment:                       - After reviewing the risks and benefits, the patient                        was deemed in satisfactory condition to undergo the                        procedure.                       After obtaining informed consent, the colonoscope was                        passed under direct vision. Throughout the procedure,                        the patient's blood pressure, pulse, and oxygen                        saturations were monitored continuously. The                        Colonoscope was introduced through the anus and                        advanced to the the cecum, identified by appendiceal                        orifice and ileocecal valve. The colonoscopy was                        performed without difficulty. The patient tolerated the                        procedure well. The quality of the bowel preparation                        was good. Findings:      A small polyp was found in the ascending colon. The polyp was sessile.       The polyp was removed with a hot snare. Resection and retrieval were       complete.      A diminutive polyp was found in  the proximal ascending colon. The polyp       was sessile. The polyp was removed with a jumbo cold forceps. Resection       and retrieval were complete.       A diminutive polyp was found in the cecum. The polyp was sessile. The       polyp was removed with a cold biopsy forceps. Resection and retrieval       were complete.      A diminutive polyp was found in the ascending colon. The polyp was       sessile. The polyp was removed with a jumbo cold forceps. Resection and       retrieval were complete. To prevent bleeding after the polypectomy, one       hemostatic clip was successfully placed. There was no bleeding at the       end of the procedure.      A diminutive polyp was found in the sigmoid colon. The polyp was       sessile. The polyp was removed with a jumbo cold forceps. Resection and       retrieval were complete.      External and internal hemorrhoids were found during endoscopy. The       hemorrhoids were medium-sized.      The exam was otherwise without abnormality. Impression:           - One small polyp in the ascending colon, removed with                        a hot snare. Resected and retrieved.                       - One diminutive polyp in the proximal ascending colon,                        removed with a jumbo cold forceps. Resected and                        retrieved.                       - One diminutive polyp in the cecum, removed with a                        cold biopsy forceps. Resected and retrieved.                       - One diminutive polyp in the ascending colon, removed                        with a jumbo cold forceps. Resected and retrieved. Clip                        was placed.                       - One diminutive polyp in the sigmoid colon, removed                        with a jumbo cold forceps. Resected and retrieved.                       -  External and internal hemorrhoids.                       - The examination was otherwise normal. Recommendation:       - Await pathology results. Manya Silvas, MD 11/25/2017 10:28:50 AM This report has been signed electronically. Number of Addenda:  0 Note Initiated On: 11/25/2017 9:29 AM Scope Withdrawal Time: 0 hours 14 minutes 49 seconds  Total Procedure Duration: 0 hours 25 minutes 29 seconds       [facility]

## 2017-11-25 NOTE — Anesthesia Post-op Follow-up Note (Signed)
Anesthesia QCDR form completed.        

## 2017-11-25 NOTE — Anesthesia Preprocedure Evaluation (Signed)
Anesthesia Evaluation  Patient identified by MRN, date of birth, ID band Patient awake    Reviewed: Allergy & Precautions, NPO status , Patient's Chart, lab work & pertinent test results, reviewed documented beta blocker date and time   Airway Mallampati: II  TM Distance: >3 FB     Dental  (+) Chipped   Pulmonary           Cardiovascular + pacemaker      Neuro/Psych    GI/Hepatic   Endo/Other    Renal/GU      Musculoskeletal   Abdominal   Peds  Hematology   Anesthesia Other Findings Hx of multiple PVCs.  Reproductive/Obstetrics                             Anesthesia Physical Anesthesia Plan  ASA: III  Anesthesia Plan: General   Post-op Pain Management:    Induction: Intravenous  PONV Risk Score and Plan:   Airway Management Planned:   Additional Equipment:   Intra-op Plan:   Post-operative Plan:   Informed Consent: I have reviewed the patients History and Physical, chart, labs and discussed the procedure including the risks, benefits and alternatives for the proposed anesthesia with the patient or authorized representative who has indicated his/her understanding and acceptance.     Plan Discussed with: CRNA  Anesthesia Plan Comments:         Anesthesia Quick Evaluation

## 2017-11-25 NOTE — H&P (Signed)
Primary Care Physician:  Adin Hector, MD Primary Gastroenterologist:  Dr. 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  Done for [facility] colon polyps.   Past Medical History:  Diagnosis Date  . Arrhythmia   . Basal cell carcinoma (BCC) of cheek 05/2011   RIGHT-REMOVED  . Cancer ([place])   . Cardiac pacemaker   . Complication of anesthesia   . H/O basal cell carcinoma excision   . History of basal cell carcinoma   . Hyperlipidemia   . Hyperlipidemia   . Internal hemorrhoids   . Internal hemorrhoids   . Lumbar disc disease   . Lumbar disc disease   . Presence of permanent cardiac pacemaker    medtronic Oct 2010  . PVC (premature ventricular contraction)   . PVC (premature ventricular contraction)   . Sinus node dysfunction (HCC)   . Sinus node dysfunction [facility])     Past Surgical History:  Procedure Laterality Date  . ANAL FISSURE REPAIR WITH PARTIAL LATERAL SPHINCTEROTOMY  1998  . BACK SURGERY  12/18/2002  . CHOLECYSTECTOMY  01/2003  . COLONOSCOPY  10/19/2012  . ESOPHAGOSCOPY N/A 03/23/2016   Procedure: ESOPHAGOSCOPY;  Surgeon: Izora Gala, MD;  Location: [place];  Service: ENT;  Laterality: N/A;  . EXCISIONAL HEMORRHOIDECTOMY     WITH FISTULECTOMY  . INCISION AND DRAINAGE PERIRECTAL ABSCESS  11/2003  . INSERT / REPLACE / REMOVE PACEMAKER    . INSERT/REPLACE/REMOVE PACEMAKER  01/24/2009  . NECK SURGERY    . PLACEMENT OF SETON  07/21/2015  . TEE WITHOUT CARDIOVERSION N/A 03/30/2016   Procedure: TRANSESOPHAGEAL ECHOCARDIOGRAM (TEE);  Surgeon: Sanda Klein, MD;  Location: [facility] ENDOSCOPY;  Service: Cardiovascular;  Laterality: N/A;  . WOUND EXPLORATION N/A 03/23/2016   Procedure: Incision and Drainage of Cervical Wound;  Surgeon: Leeroy Cha, MD;  Location: [place];  Service: Neurosurgery;  Laterality: N/A;    Prior to Admission medications   Medication Sig Start Date End Date Taking? Authorizing  Provider  colestipol (COLESTID) 1 g tablet Take 1 g by mouth daily.   Yes [provider]  aspirin EC 81 MG tablet Take 81 mg by mouth daily.    [provider]    Allergies as of 09/02/2017 - Review Complete 02/11/2017  Allergen Reaction Noted  . Rifampin Rash 05/04/2016    Family History  Problem Relation Age of Onset  . Sudden death Father   . Diabetes type II Sister     Social History   Socioeconomic History  . Marital status: Married    Spouse name: Not on file  . Number of children: Not on file  . Years of education: Not on file  . Highest education level: Not on file  Occupational History  . Not on file  Social Needs  . Financial resource strain: Not on file  . Food insecurity:    Worry: Not on file    Inability: Not on file  . Transportation needs:    Medical: Not on file    Non-medical: Not on file  Tobacco Use  . Smoking status: Never Smoker  . Smokeless tobacco: Never Used  Substance and Sexual Activity  . Alcohol use: Yes    Comment: SOCIALLY  . Drug use: No  . Sexual activity: Not on file  Lifestyle  . Physical activity:    Days per week: Not on file    Minutes per session: Not  on file  . Stress: Not on file  Relationships  . Social connections:    Talks on phone: Not on file    Gets together: Not on file    Attends religious service: Not on file    Active member of club or organization: Not on file    Attends meetings of clubs or organizations: Not on file    Relationship status: Not on file  . Intimate partner violence:    Fear of current or ex partner: Not on file    Emotionally abused: Not on file    Physically abused: Not on file    Forced sexual activity: Not on file  Other Topics Concern  . Not on file  Social History Narrative   Lives at home with wife, independent at baseline. Is a physician.    Review of Systems: See HPI, otherwise negative ROS  Physical Exam: BP 117/78   Pulse 72   Temp (!) 96.7 F (35.9  C) (Tympanic)   Resp 17   Ht 6\' 2"  (1.88 m)   Wt 103.4 kg   SpO2 99%   BMI 29.27 kg/m  General:   Alert,  pleasant and cooperative in NAD Head:  Normocephalic and atraumatic. Neck:  Supple; no masses or thyromegaly. Lungs:  Clear throughout to auscultation.    Heart:  Regular rate and rhythm. Abdomen:  Soft, nontender and nondistended. Normal bowel sounds, without guarding, and without rebound.   Neurologic:  Alert and  oriented x4;  grossly normal neurologically.  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  Risks, benefits, limitations, and alternatives regarding  colonoscopy have been reviewed with the patient.  Questions have been answered.  All parties agreeable.   [name], MD  11/25/2017, 9:49 AM

## 2017-11-29 ENCOUNTER — Encounter: Payer: Self-pay | Admitting: Unknown Physician Specialty

## 2017-11-30 LAB — SURGICAL PATHOLOGY

## 2018-02-13 ENCOUNTER — Ambulatory Visit: Payer: BC Managed Care – PPO | Admitting: Internal Medicine

## 2018-02-13 ENCOUNTER — Encounter: Payer: Self-pay | Admitting: Internal Medicine

## 2018-02-13 VITALS — BP 120/72 | HR 57 | Ht 74.0 in | Wt 239.8 lb

## 2018-02-13 DIAGNOSIS — Z950 Presence of cardiac pacemaker: Secondary | ICD-10-CM

## 2018-02-13 DIAGNOSIS — I493 Ventricular premature depolarization: Secondary | ICD-10-CM

## 2018-02-13 DIAGNOSIS — I495 Sick sinus syndrome: Secondary | ICD-10-CM

## 2018-02-13 LAB — CUP PACEART INCLINIC DEVICE CHECK
Battery Impedance: 2154 Ohm
Battery Remaining Longevity: 35 mo
Battery Voltage: 2.76 V
Brady Statistic AP VP Percent: 0 %
Brady Statistic AP VS Percent: 11 %
Brady Statistic AS VP Percent: 1 %
Brady Statistic AS VS Percent: 88 %
Date Time Interrogation Session: 20191118102413
Implantable Lead Implant Date: 20101029
Implantable Lead Implant Date: 20101029
Implantable Lead Location: 753859
Implantable Lead Location: 753860
Implantable Lead Model: 5076
Implantable Lead Model: 5076
Implantable Pulse Generator Implant Date: 20101029
Lead Channel Impedance Value: 433 Ohm
Lead Channel Impedance Value: 439 Ohm
Lead Channel Pacing Threshold Amplitude: 0.5 V
Lead Channel Pacing Threshold Amplitude: 0.5 V
Lead Channel Pacing Threshold Pulse Width: 0.4 ms
Lead Channel Pacing Threshold Pulse Width: 0.4 ms
Lead Channel Sensing Intrinsic Amplitude: 1.4 mV
Lead Channel Sensing Intrinsic Amplitude: 8 mV
Lead Channel Setting Pacing Amplitude: 2 V
Lead Channel Setting Pacing Amplitude: 2 V
Lead Channel Setting Pacing Pulse Width: 0.4 ms
Lead Channel Setting Sensing Sensitivity: 2.8 mV

## 2018-02-13 NOTE — Patient Instructions (Addendum)
Your physician recommends that you continue on your current medications as directed. Please refer to the Current Medication list given to you today.   Your physician wants you to follow-up in: YEAR WITH DR KLEIN  You will receive a reminder letter in the mail two months in advance. If you don't receive a letter, please call our office to schedule the follow-up appointment.  

## 2018-02-13 NOTE — Progress Notes (Signed)
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   He also has a history of symptomatic PVCs which have been ameliorated by the pacing.  He underwent a neck fusion procedure  This was complicated by MSSA hematoma infection as well as bacteremia.  TEE was negative   Neck is much better   Date Cr K Hgb  11/19 1.1 4.2 16.1           The patient denies chest pain, shortness of breath, nocturnal dyspnea, orthopnea or peripheral edema.  There have been no palpitations, lightheadedness or syncope.      Past Medical History:  Diagnosis Date  . Arrhythmia   . Basal cell carcinoma (BCC) of cheek 05/2011   RIGHT-REMOVED  . Cancer ([place])   . Cardiac pacemaker   . Complication of anesthesia   . H/O basal cell carcinoma excision   . History of basal cell carcinoma   . Hyperlipidemia   . Hyperlipidemia   . Internal hemorrhoids   . Internal hemorrhoids   . Lumbar disc disease   . Lumbar disc disease   . Presence of permanent cardiac pacemaker    medtronic Oct 2010  . PVC (premature ventricular contraction)   . PVC (premature ventricular contraction)   . Sinus node dysfunction (HCC)   . Sinus node dysfunction [facility]     Past Surgical History:  Procedure Laterality Date  . ANAL FISSURE REPAIR WITH PARTIAL LATERAL SPHINCTEROTOMY  1998  . BACK SURGERY  12/18/2002  . CHOLECYSTECTOMY  01/2003  . COLONOSCOPY  10/19/2012  . COLONOSCOPY WITH PROPOFOL N/A 11/25/2017   Procedure: COLONOSCOPY WITH PROPOFOL;  Surgeon: Manya Silvas, MD;  Location[facility] ENDOSCOPY;  Service: Endoscopy;  Laterality: N/A;  . ESOPHAGOSCOPY N/A 03/23/2016   Procedure: ESOPHAGOSCOPY;  Surgeon: Izora Gala, MD;  Location: [place];  Service: ENT;  Laterality: N/A;  .  EXCISIONAL HEMORRHOIDECTOMY     WITH FISTULECTOMY  . INCISION AND DRAINAGE PERIRECTAL ABSCESS  11/2003  . INSERT / REPLACE / REMOVE PACEMAKER    . INSERT/REPLACE/REMOVE PACEMAKER  01/24/2009  . NECK SURGERY    . PLACEMENT OF SETON  07/21/2015  . TEE WITHOUT CARDIOVERSION N/A 03/30/2016   Procedure: TRANSESOPHAGEAL ECHOCARDIOGRAM (TEE);  Surgeon: Sanda Ariea Rochin, MD;  [facility] ENDOSCOPY;  Service: Cardiovascular;  Laterality: N/A;  . WOUND EXPLORATION N/A 03/23/2016   Procedure: Incision and Drainage of Cervical Wound;  Surgeon: Leeroy Cha, MD;  Location: [place];  Service: Neurosurgery;  Laterality: N/A;    Current Outpatient Medications  Medication Sig Dispense Refill  . colestipol (COLESTID) 1 g tablet Take 2 g by mouth daily.     No current facility-administered medications for this visit.     Allergies  Allergen Reactions  . Kefzol [Cefazolin] Rash  . Rifampin Rash    And hepatitis      Review of Systems negative except from HPI and PMH  Physical Exam BP 120/72   Pulse (!) 57   Ht 6\' 2"  (1.88 m)   Wt 239 lb 12.8 oz (108.8 kg)   SpO2 96%   BMI 30.79 kg/m  Well developed and nourished in no acute distress HENT normal Neck supple with JVP-flat Clear Device pocket well healed;  without hematoma or erythema.  There is no tethering  Regular rate and rhythm, no murmurs or gallops Abd-soft with active BS No Clubbing cyanosis edema Skin-warm and dry A & Oriented  Grossly normal sensory and motor function     ECG sinus @ 57 16/08/39  Assessment and  Plan  SINUS NODE Dysfunction   Medtronic pacemaker   [name] device was interrogated.  The information was reviewed. No changes were made in the programming.

## 2018-02-14 ENCOUNTER — Telehealth: Payer: Self-pay | Admitting: *Deleted

## 2018-02-14 NOTE — Telephone Encounter (Signed)
LVM informing patient that remote will be rescheduled. Encouraged patient to call back with any further questions.

## 2018-05-17 ENCOUNTER — Telehealth: Payer: Self-pay

## 2018-05-17 NOTE — Telephone Encounter (Signed)
Left message for patient to remind of missed remote transmission.  

## 2018-05-26 ENCOUNTER — Encounter: Payer: Self-pay | Admitting: Cardiology

## 2018-05-29 ENCOUNTER — Ambulatory Visit (INDEPENDENT_AMBULATORY_CARE_PROVIDER_SITE_OTHER): Payer: BC Managed Care – PPO | Admitting: *Deleted

## 2018-05-29 DIAGNOSIS — I495 Sick sinus syndrome: Secondary | ICD-10-CM | POA: Diagnosis not present

## 2018-05-30 ENCOUNTER — Telehealth: Payer: Self-pay

## 2018-05-30 NOTE — Telephone Encounter (Signed)
Left message for patient to remind of missed remote transmission.  

## 2018-06-01 LAB — CUP PACEART REMOTE DEVICE CHECK
Battery Impedance: 2109 Ohm
Battery Remaining Longevity: 36 mo
Battery Voltage: 2.75 V
Brady Statistic AP VP Percent: 0 %
Brady Statistic AP VS Percent: 8 %
Brady Statistic AS VP Percent: 0 %
Brady Statistic AS VS Percent: 92 %
Date Time Interrogation Session: 20200301011925
Implantable Lead Implant Date: 20101029
Implantable Lead Implant Date: 20101029
Implantable Lead Location: 753859
Implantable Lead Location: 753860
Implantable Lead Model: 5076
Implantable Lead Model: 5076
Implantable Pulse Generator Implant Date: 20101029
Lead Channel Impedance Value: 448 Ohm
Lead Channel Impedance Value: 466 Ohm
Lead Channel Pacing Threshold Amplitude: 0.375 V
Lead Channel Pacing Threshold Amplitude: 0.625 V
Lead Channel Pacing Threshold Pulse Width: 0.4 ms
Lead Channel Pacing Threshold Pulse Width: 0.4 ms
Lead Channel Setting Pacing Amplitude: 2 V
Lead Channel Setting Pacing Amplitude: 2 V
Lead Channel Setting Pacing Pulse Width: 0.4 ms
Lead Channel Setting Sensing Sensitivity: 2.8 mV

## 2018-06-05 NOTE — Progress Notes (Signed)
Remote pacemaker transmission.   

## 2018-12-01 ENCOUNTER — Ambulatory Visit (INDEPENDENT_AMBULATORY_CARE_PROVIDER_SITE_OTHER): Payer: BC Managed Care – PPO | Admitting: *Deleted

## 2018-12-01 DIAGNOSIS — I495 Sick sinus syndrome: Secondary | ICD-10-CM

## 2018-12-01 LAB — CUP PACEART REMOTE DEVICE CHECK
Battery Impedance: 2646 Ohm
Battery Remaining Longevity: 30 mo
Battery Voltage: 2.75 V
Brady Statistic AP VP Percent: 0 %
Brady Statistic AP VS Percent: 10 %
Brady Statistic AS VP Percent: 0 %
Brady Statistic AS VS Percent: 90 %
Date Time Interrogation Session: 20200904162642
Implantable Lead Implant Date: 20101029
Implantable Lead Implant Date: 20101029
Implantable Lead Location: 753859
Implantable Lead Location: 753860
Implantable Lead Model: 5076
Implantable Lead Model: 5076
Implantable Pulse Generator Implant Date: 20101029
Lead Channel Impedance Value: 394 Ohm
Lead Channel Impedance Value: 435 Ohm
Lead Channel Pacing Threshold Amplitude: 0.5 V
Lead Channel Pacing Threshold Amplitude: 0.625 V
Lead Channel Pacing Threshold Pulse Width: 0.4 ms
Lead Channel Pacing Threshold Pulse Width: 0.4 ms
Lead Channel Setting Pacing Amplitude: 2 V
Lead Channel Setting Pacing Amplitude: 2 V
Lead Channel Setting Pacing Pulse Width: 0.4 ms
Lead Channel Setting Sensing Sensitivity: 2 mV

## 2018-12-13 ENCOUNTER — Encounter: Payer: Self-pay | Admitting: Cardiology

## 2018-12-13 NOTE — Progress Notes (Signed)
Remote pacemaker transmission.   

## 2019-02-12 ENCOUNTER — Ambulatory Visit: Payer: BC Managed Care – PPO | Admitting: Internal Medicine

## 2019-02-12 ENCOUNTER — Encounter: Payer: Self-pay | Admitting: Internal Medicine

## 2019-02-12 ENCOUNTER — Other Ambulatory Visit: Payer: Self-pay

## 2019-02-12 VITALS — BP 120/74 | HR 64 | Ht 74.0 in | Wt 235.0 lb

## 2019-02-12 DIAGNOSIS — I495 Sick sinus syndrome: Secondary | ICD-10-CM | POA: Diagnosis not present

## 2019-02-12 DIAGNOSIS — I493 Ventricular premature depolarization: Secondary | ICD-10-CM

## 2019-02-12 DIAGNOSIS — Z950 Presence of cardiac pacemaker: Secondary | ICD-10-CM | POA: Diagnosis not present

## 2019-02-12 LAB — CUP PACEART INCLINIC DEVICE CHECK
Battery Impedance: 2578 Ohm
Battery Remaining Longevity: 30 mo
Battery Voltage: 2.75 V
Brady Statistic AP VP Percent: 0 %
Brady Statistic AP VS Percent: 10 %
Brady Statistic AS VP Percent: 0 %
Brady Statistic AS VS Percent: 90 %
Date Time Interrogation Session: 20201116181714
Implantable Lead Implant Date: 20101029
Implantable Lead Implant Date: 20101029
Implantable Lead Location: 753859
Implantable Lead Location: 753860
Implantable Lead Model: 5076
Implantable Lead Model: 5076
Implantable Pulse Generator Implant Date: 20101029
Lead Channel Impedance Value: 411 Ohm
Lead Channel Impedance Value: 424 Ohm
Lead Channel Pacing Threshold Amplitude: 0.5 V
Lead Channel Pacing Threshold Amplitude: 0.75 V
Lead Channel Pacing Threshold Pulse Width: 0.4 ms
Lead Channel Pacing Threshold Pulse Width: 0.4 ms
Lead Channel Setting Pacing Amplitude: 2 V
Lead Channel Setting Pacing Amplitude: 2 V
Lead Channel Setting Pacing Pulse Width: 0.4 ms
Lead Channel Setting Sensing Sensitivity: 2.8 mV

## 2019-02-12 NOTE — Progress Notes (Signed)
Patient Care Team: Adin Hector, MD as PCP - General (Internal Medicine) Deboraha Sprang, MD as PCP - Cardiology (Cardiology)   HPI  Bob Chiquito, MD is a 64 y.o. 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   He also has a history of symptomatic PVCs which have been ameliorated by the pacing.  The patient denies chest pain, shortness of breath, nocturnal dyspnea, orthopnea or peripheral edema.  There have been no palpitations, lightheadedness or syncope.     Date Cr K Hgb  11/19 1.1 4.2 16.1  11/20  1.1 4.9 15.7       Past Medical History:  Diagnosis Date  . Arrhythmia   . Basal cell carcinoma (BCC) of cheek 05/2011   RIGHT-REMOVED  . Cancer ([place])   . Cardiac pacemaker   . Complication of anesthesia   . H/O basal cell carcinoma excision   . History of basal cell carcinoma   . Hyperlipidemia   . Hyperlipidemia   . Internal hemorrhoids   . Internal hemorrhoids   . Lumbar disc disease   . Lumbar disc disease   . Presence of permanent cardiac pacemaker    medtronic Oct 2010  . PVC (premature ventricular contraction)   . PVC (premature ventricular contraction)   . Sinus node dysfunction (HCC)   . Sinus node dysfunction [facility])     Past Surgical History:  Procedure Laterality Date  . ANAL FISSURE REPAIR WITH PARTIAL LATERAL SPHINCTEROTOMY  1998  . BACK SURGERY  12/18/2002  . CHOLECYSTECTOMY  01/2003  . COLONOSCOPY  10/19/2012  . COLONOSCOPY WITH PROPOFOL N/A 11/25/2017   Procedure: COLONOSCOPY WITH PROPOFOL;  Surgeon: Manya Silvas, MD;  Location: [facility] ENDOSCOPY;  Service: Endoscopy;  Laterality: N/A;  . ESOPHAGOSCOPY N/A 03/23/2016   Procedure: ESOPHAGOSCOPY;  Surgeon: Izora Gala, MD;  Location: [place];  Service: ENT;  Laterality: N/A;  . EXCISIONAL HEMORRHOIDECTOMY     WITH FISTULECTOMY  . INCISION AND DRAINAGE PERIRECTAL ABSCESS  11/2003  . INSERT / REPLACE / REMOVE  PACEMAKER    . INSERT/REPLACE/REMOVE PACEMAKER  01/24/2009  . NECK SURGERY    . PLACEMENT OF SETON  07/21/2015  . TEE WITHOUT CARDIOVERSION N/A 03/30/2016   Procedure: TRANSESOPHAGEAL ECHOCARDIOGRAM (TEE);  Surgeon: Sanda Vanderbilt Ranieri, MD;  Location: [facility] ENDOSCOPY;  Service: Cardiovascular;  Laterality: N/A;  . WOUND EXPLORATION N/A 03/23/2016   Procedure: Incision and Drainage of Cervical Wound;  Surgeon: Leeroy Cha, MD;  Location: [place];  Service: Neurosurgery;  Laterality: N/A;    Current Outpatient Medications  Medication Sig Dispense Refill  . colestipol (COLESTID) 1 g tablet Take 2 g by mouth daily.     No current facility-administered medications for this visit.     Allergies  Allergen Reactions  . Kefzol [Cefazolin] Rash  . Rifampin Rash    And hepatitis      Review of Systems negative except from HPI and PMH  Physical Exam BP 120/74   Pulse 64   Ht 6\' 2"  (1.88 m)   Wt 235 lb (106.6 kg)   SpO2 97%   BMI 30.17 kg/m  Well developed and well nourished in no acute distress HENT normal Neck supple with JVP-flat Clear Device pocket well healed; without hematoma or erythema.  There is no tethering  Regular rate and rhythm, no  gallop murmur Abd-soft with active BS No Clubbing cyanosis  edema Skin-warm and dry A &  Oriented  Grossly normal sensory and motor function  ECG sinus rhythm at 64 Interval 16/08/38 Axis left -36   Assessment and  Plan  SINUS NODE Dysfunction   Medtronic pacemaker [name] device was interrogated.  The information was reviewed. No changes were made in the programming.     No symptoms  Excellent exercise tolerance.  We will see again in 1 year.  [place] is moved [place] he prefers at this point to continue his follow-up here in [place].

## 2019-02-12 NOTE — Patient Instructions (Signed)
Medication Instructions:  The current medical regimen is effective;  continue present plan and medications.  *If you need a refill on your cardiac medications before your next appointment, please call your pharmacy*  Follow-Up: At [facility], you and your health needs are our priority.  As part of our continuing mission to provide you with exceptional heart care, we have created designated Provider Care Teams.  These Care Teams include your primary Cardiologist (physician) and Advanced Practice Providers (APPs -  Physician Assistants and Nurse Practitioners) who all work together to provide you with the care you need, when you need it.  Your next appointment:   12 months  The format for your next appointment:   In Person  Provider:   Virl Axe, MD  Thank you for choosing [facility]!!

## 2019-06-15 ENCOUNTER — Ambulatory Visit (INDEPENDENT_AMBULATORY_CARE_PROVIDER_SITE_OTHER): Payer: BC Managed Care – PPO | Admitting: *Deleted

## 2019-06-15 DIAGNOSIS — I495 Sick sinus syndrome: Secondary | ICD-10-CM

## 2019-06-16 LAB — CUP PACEART REMOTE DEVICE CHECK
Battery Impedance: 2924 Ohm
Battery Remaining Longevity: 27 mo
Battery Voltage: 2.74 V
Brady Statistic AP VP Percent: 0 %
Brady Statistic AP VS Percent: 11 %
Brady Statistic AS VP Percent: 0 %
Brady Statistic AS VS Percent: 89 %
Date Time Interrogation Session: 20210319174847
Implantable Lead Implant Date: 20101029
Implantable Lead Implant Date: 20101029
Implantable Lead Location: 753859
Implantable Lead Location: 753860
Implantable Lead Model: 5076
Implantable Lead Model: 5076
Implantable Pulse Generator Implant Date: 20101029
Lead Channel Impedance Value: 413 Ohm
Lead Channel Impedance Value: 443 Ohm
Lead Channel Pacing Threshold Amplitude: 0.5 V
Lead Channel Pacing Threshold Amplitude: 0.75 V
Lead Channel Pacing Threshold Pulse Width: 0.4 ms
Lead Channel Pacing Threshold Pulse Width: 0.4 ms
Lead Channel Setting Pacing Amplitude: 2 V
Lead Channel Setting Pacing Amplitude: 2 V
Lead Channel Setting Pacing Pulse Width: 0.4 ms
Lead Channel Setting Sensing Sensitivity: 2 mV

## 2019-12-21 ENCOUNTER — Ambulatory Visit (INDEPENDENT_AMBULATORY_CARE_PROVIDER_SITE_OTHER): Payer: BC Managed Care – PPO | Admitting: Emergency Medicine

## 2019-12-21 DIAGNOSIS — I495 Sick sinus syndrome: Secondary | ICD-10-CM | POA: Diagnosis not present

## 2019-12-22 LAB — CUP PACEART REMOTE DEVICE CHECK
Battery Impedance: 3725 Ohm
Battery Remaining Longevity: 20 mo
Battery Voltage: 2.71 V
Brady Statistic AP VP Percent: 0 %
Brady Statistic AP VS Percent: 12 %
Brady Statistic AS VP Percent: 0 %
Brady Statistic AS VS Percent: 88 %
Date Time Interrogation Session: 20210924105814
Implantable Lead Implant Date: 20101029
Implantable Lead Implant Date: 20101029
Implantable Lead Location: 753859
Implantable Lead Location: 753860
Implantable Lead Model: 5076
Implantable Lead Model: 5076
Implantable Pulse Generator Implant Date: 20101029
Lead Channel Impedance Value: 402 Ohm
Lead Channel Impedance Value: 438 Ohm
Lead Channel Pacing Threshold Amplitude: 0.5 V
Lead Channel Pacing Threshold Amplitude: 0.75 V
Lead Channel Pacing Threshold Pulse Width: 0.4 ms
Lead Channel Pacing Threshold Pulse Width: 0.4 ms
Lead Channel Setting Pacing Amplitude: 2 V
Lead Channel Setting Pacing Amplitude: 2 V
Lead Channel Setting Pacing Pulse Width: 0.4 ms
Lead Channel Setting Sensing Sensitivity: 2 mV

## 2019-12-25 NOTE — Progress Notes (Signed)
Remote pacemaker transmission.   

## 2020-02-18 ENCOUNTER — Encounter: Payer: BC Managed Care – PPO | Admitting: Student

## 2020-03-20 ENCOUNTER — Encounter: Payer: BC Managed Care – PPO | Admitting: Internal Medicine

## 2020-04-14 ENCOUNTER — Telehealth: Payer: Self-pay | Admitting: Internal Medicine

## 2020-05-02 ENCOUNTER — Ambulatory Visit: Payer: BC Managed Care – PPO | Admitting: Internal Medicine

## 2020-05-02 ENCOUNTER — Encounter: Payer: Self-pay | Admitting: Internal Medicine

## 2020-05-02 ENCOUNTER — Other Ambulatory Visit: Payer: Self-pay

## 2020-05-02 VITALS — BP 102/58 | HR 55 | Ht 73.0 in | Wt 232.0 lb

## 2020-05-02 DIAGNOSIS — I495 Sick sinus syndrome: Secondary | ICD-10-CM | POA: Diagnosis not present

## 2020-05-02 DIAGNOSIS — Z950 Presence of cardiac pacemaker: Secondary | ICD-10-CM

## 2020-05-02 NOTE — Patient Instructions (Signed)

## 2020-05-02 NOTE — Progress Notes (Signed)
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   He also has a history of symptomatic PVCs which have been ameliorated by the pacing.  The patient denies chest pain, shortness of breath, nocturnal dyspnea, orthopnea or peripheral edema.  There have been no palpitations, lightheadedness or syncope.    History of staph aureus bacteremia 2018  2/2 neck surgery  The patient denies chest pain, shortness of breath, nocturnal dyspnea, orthopnea or peripheral edema.  There have been no palpitations, lightheadedness or syncope.     Date Cr K Hgb  11/19 1.1 4.2 16.1  11/20  1.1 4.9 15.7          DATE TEST EF   1/18 TEE  55-60 %            Past Medical History:  Diagnosis Date  . Basal cell carcinoma (BCC) of cheek 05/2011   RIGHT-REMOVED  . Cancer ([place])   . Complication of anesthesia   . Hyperlipidemia   . Internal hemorrhoids   . Lumbar disc disease   . Presence of permanent cardiac pacemaker    medtronic Oct 2010  . PVC (premature ventricular contraction)   . Sinus node dysfunction [facility])     Past Surgical History:  Procedure Laterality Date  . ANAL FISSURE REPAIR WITH PARTIAL LATERAL SPHINCTEROTOMY  1998  . BACK SURGERY  12/18/2002  . CHOLECYSTECTOMY  01/2003  . COLONOSCOPY  10/19/2012  . COLONOSCOPY WITH PROPOFOL N/A 11/25/2017   Procedure: COLONOSCOPY WITH PROPOFOL;  Surgeon: Manya Silvas, MD;  Location: [facility] ENDOSCOPY;  Service: Endoscopy;  Laterality: N/A;  . ESOPHAGOSCOPY N/A 03/23/2016   Procedure: ESOPHAGOSCOPY;  Surgeon: Izora Gala, MD;  Location: [place];  Service: ENT;  Laterality: N/A;  . EXCISIONAL HEMORRHOIDECTOMY     WITH FISTULECTOMY  . INCISION AND DRAINAGE PERIRECTAL ABSCESS  11/2003  . INSERT / REPLACE  / REMOVE PACEMAKER    . INSERT/REPLACE/REMOVE PACEMAKER  01/24/2009  . NECK SURGERY    . PLACEMENT OF SETON  07/21/2015  . TEE WITHOUT CARDIOVERSION N/A 03/30/2016   Procedure: TRANSESOPHAGEAL ECHOCARDIOGRAM (TEE);  Surgeon: Sanda Asael Pann, MD;  Location: [facility] ENDOSCOPY;  Service: Cardiovascular;  Laterality: N/A;  . WOUND EXPLORATION N/A 03/23/2016   Procedure: Incision and Drainage of Cervical Wound;  Surgeon: Leeroy Cha, MD;  Location: [place];  Service: Neurosurgery;  Laterality: N/A;    Current Outpatient Medications  Medication Sig Dispense Refill  . colestipol (COLESTID) 1 g tablet Take 2 g by mouth daily.     No current facility-administered medications for this visit.    Allergies  Allergen Reactions  . Kefzol [Cefazolin] Rash  . Rifampin Rash    And hepatitis      Review of Systems negative except from HPI and PMH  Physical Exam BP (!) 102/58   Pulse (!) 55   Ht 6\' 1"  (1.854 m)   Wt 232 lb (105.2 kg)   SpO2 98%   BMI 30.61 kg/m  Well developed and well nourished in no acute distress HENT normal Neck supple with JVP-flat Clear Device pocket well healed; without hematoma or erythema.  There is no tethering  Regular rate and rhythm, no  murmur Abd-soft with active BS No Clubbing  cyanosis   edema Skin-warm and dry A & Oriented  Grossly normal sensory and motor function  ECG  sinsu 55 16/08/40   Assessment and  Plan  SINUS NODE Dysfunction   Medtronic pacemaker   Nocturnal bradycardia   Normal device function   The nocturnal pauses and bradycardia might have been a sign for sleep apnea so will reach out to his PCP regarding this and home sleep study This might inform the need to replace generator as he reaches [place]

## 2020-05-13 LAB — CUP PACEART INCLINIC DEVICE CHECK
Battery Impedance: 4063 Ohm
Battery Remaining Longevity: 17 mo
Battery Voltage: 2.71 V
Brady Statistic AP VP Percent: 0 %
Brady Statistic AP VS Percent: 13 %
Brady Statistic AS VP Percent: 0 %
Brady Statistic AS VS Percent: 87 %
Date Time Interrogation Session: 20220204143000
Implantable Lead Implant Date: 20101029
Implantable Lead Implant Date: 20101029
Implantable Lead Location: 753859
Implantable Lead Location: 753860
Implantable Lead Model: 5076
Implantable Lead Model: 5076
Implantable Pulse Generator Implant Date: 20101029
Lead Channel Impedance Value: 424 Ohm
Lead Channel Impedance Value: 428 Ohm
Lead Channel Pacing Threshold Amplitude: 0.5 V
Lead Channel Pacing Threshold Amplitude: 0.75 V
Lead Channel Pacing Threshold Pulse Width: 0.4 ms
Lead Channel Pacing Threshold Pulse Width: 0.4 ms
Lead Channel Sensing Intrinsic Amplitude: 11.2 mV
Lead Channel Sensing Intrinsic Amplitude: 2 mV
Lead Channel Setting Pacing Amplitude: 2 V
Lead Channel Setting Pacing Amplitude: 2 V
Lead Channel Setting Pacing Pulse Width: 0.4 ms
Lead Channel Setting Sensing Sensitivity: 2.8 mV

## 2020-06-20 ENCOUNTER — Ambulatory Visit (INDEPENDENT_AMBULATORY_CARE_PROVIDER_SITE_OTHER): Payer: BC Managed Care – PPO

## 2020-06-20 DIAGNOSIS — I495 Sick sinus syndrome: Secondary | ICD-10-CM

## 2020-06-21 LAB — CUP PACEART REMOTE DEVICE CHECK
Battery Impedance: 4682 Ohm
Battery Remaining Longevity: 13 mo
Battery Voltage: 2.68 V
Brady Statistic AP VP Percent: 0 %
Brady Statistic AP VS Percent: 13 %
Brady Statistic AS VP Percent: 0 %
Brady Statistic AS VS Percent: 87 %
Date Time Interrogation Session: 20220325122117
Implantable Lead Implant Date: 20101029
Implantable Lead Implant Date: 20101029
Implantable Lead Location: 753859
Implantable Lead Location: 753860
Implantable Lead Model: 5076
Implantable Lead Model: 5076
Implantable Pulse Generator Implant Date: 20101029
Lead Channel Impedance Value: 444 Ohm
Lead Channel Impedance Value: 449 Ohm
Lead Channel Pacing Threshold Amplitude: 0.5 V
Lead Channel Pacing Threshold Amplitude: 0.625 V
Lead Channel Pacing Threshold Pulse Width: 0.4 ms
Lead Channel Pacing Threshold Pulse Width: 0.4 ms
Lead Channel Setting Pacing Amplitude: 2 V
Lead Channel Setting Pacing Amplitude: 2 V
Lead Channel Setting Pacing Pulse Width: 0.4 ms
Lead Channel Setting Sensing Sensitivity: 2.8 mV

## 2020-07-03 NOTE — Progress Notes (Signed)
Remote pacemaker transmission.   

## 2020-08-29 ENCOUNTER — Ambulatory Visit: Payer: BC Managed Care – PPO | Admitting: Sports Medicine

## 2020-08-29 ENCOUNTER — Ambulatory Visit (INDEPENDENT_AMBULATORY_CARE_PROVIDER_SITE_OTHER): Payer: BC Managed Care – PPO

## 2020-08-29 ENCOUNTER — Encounter: Payer: Self-pay | Admitting: Sports Medicine

## 2020-08-29 ENCOUNTER — Other Ambulatory Visit: Payer: Self-pay

## 2020-08-29 DIAGNOSIS — M7751 Other enthesopathy of right foot: Secondary | ICD-10-CM | POA: Diagnosis not present

## 2020-08-29 DIAGNOSIS — M19071 Primary osteoarthritis, right ankle and foot: Secondary | ICD-10-CM

## 2020-08-29 DIAGNOSIS — M2141 Flat foot [pes planus] (acquired), right foot: Secondary | ICD-10-CM | POA: Diagnosis not present

## 2020-08-29 DIAGNOSIS — M2142 Flat foot [pes planus] (acquired), left foot: Secondary | ICD-10-CM

## 2020-08-29 DIAGNOSIS — M778 Other enthesopathies, not elsewhere classified: Secondary | ICD-10-CM

## 2020-08-29 MED ORDER — TRIAMCINOLONE ACETONIDE 10 MG/ML IJ SUSP: 10.0000 mg | Freq: Once | INTRAMUSCULAR | Status: AC

## 2020-08-29 NOTE — Progress Notes (Signed)
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  Patient reports that he has most pain when he raises his toes and it is very localized to the top.  Patient reports that he has tried resting topical rub and changing of shoes but the pain remains.  Patient denies any specific trauma or injury.  No other pedal complaints noted.  Patient Active Problem List   Diagnosis Date Noted  . Drug induced fever 04/29/2016  . Abscess   . Staphylococcus aureus bacteremia   . Hematoma of neck 03/23/2016  . Wound infection after surgery 03/23/2016  . PVC (premature ventricular contraction)   . Sinus node dysfunction (HCC)   . Cardiac pacemaker     Current Outpatient Medications on File Prior to Visit  Medication Sig Dispense Refill  . colestipol (COLESTID) 1 g tablet Take 2 g by mouth daily.     No current facility-administered medications on file prior to visit.    No Known Allergies  Objective:  General: Alert and oriented x3 in no acute distress  Dermatology: No open lesions bilateral lower extremities, no webspace macerations, no ecchymosis bilateral, all nails x 10 are well manicured.  Vascular: Dorsalis Pedis and Posterior Tibial pedal pulses palpable, Capillary Fill Time 3 seconds,(+) pedal hair growth bilateral, no edema bilateral lower extremities, Temperature gradient within normal limits.  Neurology: Gross sensation intact via light touch.  Musculoskeletal: Mild tenderness with palpation at right dorsal midfoot adjacent to palpable bone spur.  There is midtarsal breech supportive of pes planus deformity noted.  Xrays  Right foot   Impression: There is diffuse joint space narrowing and midtarsal breech supportive of pes planus pain arthritis.  No obvious fracture or dislocation noted.  Assessment and Plan: Problem List Items Addressed This Visit   None   Visit  Diagnoses    Capsulitis of foot, unspecified laterality    -  Primary   Relevant Orders   DG Foot Complete Right (Completed)   Tendinitis of right foot       Arthritis of midtarsal joint of right foot       Pes planus of both feet           -Complete examination performed -Xrays reviewed -Discussed treatement options for likely tendinitis or area of arthritis at the midtarsal joint of the right foot -After oral consent and aseptic prep, injected a mixture containing 1 ml of 2%  plain lidocaine, 1 ml 0.5% plain marcaine, 0.5 ml of kenalog 10 and 0.5 ml of dexamethasone phosphate into right dorsal lateral midfoot without complication. Post-injection care discussed with patient.  -Recommend good supportive shoes daily for foot type -Recommend rest ice and elevation especially for today -Patient to return to office as needed or sooner if condition worsens or fails to improve.  Landis Martins, DPM

## 2020-08-29 NOTE — Patient Instructions (Signed)

## 2020-12-19 ENCOUNTER — Ambulatory Visit (INDEPENDENT_AMBULATORY_CARE_PROVIDER_SITE_OTHER): Payer: BC Managed Care – PPO

## 2020-12-19 DIAGNOSIS — I495 Sick sinus syndrome: Secondary | ICD-10-CM | POA: Diagnosis not present

## 2020-12-23 LAB — CUP PACEART REMOTE DEVICE CHECK
Battery Impedance: 9600 Ohm
Battery Voltage: 2.57 V
Brady Statistic RV Percent Paced: 0 %
Date Time Interrogation Session: 20220924194332
Implantable Lead Implant Date: 20101029
Implantable Lead Implant Date: 20101029
Implantable Lead Location: 753859
Implantable Lead Location: 753860
Implantable Lead Model: 5076
Implantable Lead Model: 5076
Implantable Pulse Generator Implant Date: 20101029
Lead Channel Impedance Value: 415 Ohm
Lead Channel Impedance Value: 67 Ohm
Lead Channel Setting Pacing Amplitude: 2 V
Lead Channel Setting Pacing Pulse Width: 0.4 ms
Lead Channel Setting Sensing Sensitivity: 5.6 mV

## 2020-12-24 NOTE — Progress Notes (Signed)
Remote pacemaker transmission.   

## 2020-12-26 ENCOUNTER — Ambulatory Visit: Payer: BC Managed Care – PPO | Admitting: Internal Medicine

## 2020-12-26 ENCOUNTER — Other Ambulatory Visit: Payer: Self-pay

## 2020-12-26 ENCOUNTER — Encounter: Payer: Self-pay | Admitting: Internal Medicine

## 2020-12-26 VITALS — BP 138/74 | HR 67 | Ht 73.0 in | Wt 240.8 lb

## 2020-12-26 DIAGNOSIS — Z950 Presence of cardiac pacemaker: Secondary | ICD-10-CM

## 2020-12-26 DIAGNOSIS — I495 Sick sinus syndrome: Secondary | ICD-10-CM | POA: Diagnosis not present

## 2020-12-26 NOTE — Progress Notes (Signed)
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   He also has a history of symptomatic PVCs which have been ameliorated by the pacing.  History of staph aureus bacteremia 2018  2/2 neck surgery The patient denies chest pain, shortness of breath, nocturnal dyspnea, orthopnea or peripheral edema.  There have been no palpitations, lightheadedness or syncope.  Somewhat surprisingly he has been unaware of reversion to VVI mode.  Reviewed his sleep study report.  AHI was 9.  Weight is up about 20 pounds this year  Date Cr K Hgb  11/19 1.1 4.2 16.1  11/20  1.1 4.9 15.7  12/21 1.1 4.6 Admitted     DATE TEST EF   1/18 TEE  55-60 %            Past Medical History:  Diagnosis Date   Basal cell carcinoma (BCC) of cheek 05/2011   RIGHT-REMOVED   Cancer ([place])    Complication of anesthesia    Hyperlipidemia    Internal hemorrhoids    Lumbar disc disease    Presence of permanent cardiac pacemaker    medtronic Oct 2010   PVC (premature ventricular contraction)    Sinus node dysfunction [facility])     Past Surgical History:  Procedure Laterality Date   ANAL FISSURE REPAIR WITH PARTIAL LATERAL SPHINCTEROTOMY  1998   BACK SURGERY  12/18/2002   CHOLECYSTECTOMY  01/2003   COLONOSCOPY  10/19/2012   COLONOSCOPY WITH PROPOFOL N/A 11/25/2017   Procedure: COLONOSCOPY WITH PROPOFOL;  Surgeon: Manya Silvas, MD;  Location: [facility] ENDOSCOPY;  Service: Endoscopy;  Laterality: N/A;   ESOPHAGOSCOPY N/A 03/23/2016   Procedure: ESOPHAGOSCOPY;  Surgeon: Izora Gala, MD;  Location: [place];  Service: ENT;  Laterality: N/A;   EXCISIONAL HEMORRHOIDECTOMY     WITH FISTULECTOMY   INCISION AND DRAINAGE PERIRECTAL ABSCESS  11/2003   INSERT / REPLACE / REMOVE  PACEMAKER     INSERT/REPLACE/REMOVE PACEMAKER  01/24/2009   NECK SURGERY     PLACEMENT OF SETON  07/21/2015   TEE WITHOUT CARDIOVERSION N/A 03/30/2016   Procedure: TRANSESOPHAGEAL ECHOCARDIOGRAM (TEE);  Surgeon: Sanda Kitara Hebb, MD;  Location: [facility] ENDOSCOPY;  Service: Cardiovascular;  Laterality: N/A;   WOUND EXPLORATION N/A 03/23/2016   Procedure: Incision and Drainage of Cervical Wound;  Surgeon: Leeroy Cha, MD;  Location: [place];  Service: Neurosurgery;  Laterality: N/A;    Current Outpatient Medications  Medication Sig Dispense Refill   colestipol (COLESTID) 1 g tablet Take 2 g by mouth daily.     Current Facility-Administered Medications  Medication Dose Route Frequency Provider Last Rate Last Admin   triamcinolone acetonide (KENALOG) 10 MG/ML injection 10 mg  10 mg Other Once Landis Martins, DPM        No Known Allergies     Review of Systems negative except from HPI and PMH  Physical Exam BP 138/74   Pulse 67   Ht 6\' 1"  (1.854 m)   Wt 240 lb 12.8 oz (109.2 kg)   SpO2 98%   BMI 31.77 kg/m  Well developed and well nourished in no acute distress HENT normal Neck supple with JVP-flat Clear Device pocket well healed; without hematoma or erythema.  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  Reverted to VVI.  Surprisingly asymptomatic.We have reviewed the benefits and risks of generator replacement.  These include but are not limited to lead fracture and infection.  The patient understands I have a patient who has reverted to VVI agrees and is willing to proceed.    I am leaving town next week.  I have arranged for Dr. Lovena Le to  do it on Monday

## 2020-12-26 NOTE — Patient Instructions (Signed)
Medication Instructions:  ?Your physician recommends that you continue on your current medications as directed. Please refer to the Current Medication list given to you today. ? ?Labwork: ?You will get lab work today:  CBC and BMP ? ?Testing/Procedures: ?None ordered. ? ?Follow-Up: ? ?SEE INSTRUCTION LETTER ? ?Any Other Special Instructions Will Be Listed Below (If Applicable). ? ?If you need a refill on your cardiac medications before your next appointment, please call your pharmacy.  ? ? ? ? ? ?

## 2020-12-27 LAB — BASIC METABOLIC PANEL
BUN/Creatinine Ratio: 11 (ref 10–24)
BUN: 14 mg/dL (ref 8–27)
CO2: 19 mmol/L — ABNORMAL LOW (ref 20–29)
Calcium: 9.6 mg/dL (ref 8.6–10.2)
Chloride: 101 mmol/L (ref 96–106)
Creatinine, Ser: 1.27 mg/dL (ref 0.76–1.27)
Glucose: 97 mg/dL (ref 70–99)
Potassium: 4.5 mmol/L (ref 3.5–5.2)
Sodium: 143 mmol/L (ref 134–144)
eGFR: 63 mL/min/{1.73_m2} (ref >59)

## 2020-12-27 LAB — CBC WITH DIFFERENTIAL/PLATELET
Basophils Absolute: 0.1 10*3/uL (ref 0.0–0.2)
Basos: 1 %
EOS (ABSOLUTE): 0.2 10*3/uL (ref 0.0–0.4)
Eos: 2 %
Hematocrit: 49.9 % (ref 37.5–51.0)
Hemoglobin: 16.7 g/dL (ref 13.0–17.7)
Immature Grans (Abs): 0.1 10*3/uL (ref 0.0–0.1)
Immature Granulocytes: 1 %
Lymphocytes Absolute: 3.2 10*3/uL — ABNORMAL HIGH (ref 0.7–3.1)
Lymphs: 27 %
MCH: 28.8 pg (ref 26.6–33.0)
MCHC: 33.5 g/dL (ref 31.5–35.7)
MCV: 86 fL (ref 79–97)
Monocytes Absolute: 0.9 10*3/uL (ref 0.1–0.9)
Monocytes: 7 %
Neutrophils Absolute: 7.4 10*3/uL — ABNORMAL HIGH (ref 1.4–7.0)
Neutrophils: 62 %
Platelets: 193 10*3/uL (ref 150–450)
RBC: 5.79 x10E6/uL (ref 4.14–5.80)
RDW: 13.7 % (ref 11.6–15.4)
WBC: 11.8 10*3/uL — ABNORMAL HIGH (ref 3.4–10.8)

## 2020-12-29 ENCOUNTER — Ambulatory Visit (HOSPITAL_COMMUNITY)
Admission: RE | Admit: 2020-12-29 | Discharge: 2020-12-29 | Disposition: A | Discharge status: Home / Self Care | Payer: BC Managed Care – PPO | Attending: Internal Medicine | Admitting: Internal Medicine

## 2020-12-29 ENCOUNTER — Encounter (HOSPITAL_COMMUNITY): Payer: Self-pay | Admitting: Internal Medicine

## 2020-12-29 ENCOUNTER — Ambulatory Visit (HOSPITAL_COMMUNITY)
Admission: RE | Disposition: A | Discharge status: Home / Self Care | Payer: Self-pay | Source: Home / Self Care | Attending: Internal Medicine

## 2020-12-29 DIAGNOSIS — Z79899 Other long term (current) drug therapy: Secondary | ICD-10-CM | POA: Insufficient documentation

## 2020-12-29 DIAGNOSIS — I495 Sick sinus syndrome: Secondary | ICD-10-CM | POA: Insufficient documentation

## 2020-12-29 DIAGNOSIS — Z45010 Encounter for checking and testing of cardiac pacemaker pulse generator [battery]: Secondary | ICD-10-CM

## 2020-12-29 HISTORY — PX: PPM GENERATOR CHANGEOUT: EP1233

## 2020-12-29 SURGERY — PPM GENERATOR CHANGEOUT

## 2020-12-29 MED ORDER — ACETAMINOPHEN 325 MG PO TABS
325.0000 mg | ORAL_TABLET | ORAL | Status: DC | PRN
  Filled 2020-12-29: qty 2

## 2020-12-29 MED ORDER — CEFAZOLIN SODIUM-DEXTROSE 2-4 GM/100ML-% IV SOLN
2.0000 g | INTRAVENOUS | Status: AC
  Administered 2020-12-29: 2 g via INTRAVENOUS

## 2020-12-29 MED ORDER — LIDOCAINE HCL 1 % IJ SOLN
INTRAMUSCULAR | Status: AC
  Filled 2020-12-29: qty 60

## 2020-12-29 MED ORDER — SODIUM CHLORIDE 0.9 % IV SOLN: INTRAVENOUS | Status: DC

## 2020-12-29 MED ORDER — ONDANSETRON HCL 4 MG/2ML IJ SOLN: 4.0000 mg | Freq: Four times a day (QID) | INTRAMUSCULAR | Status: DC | PRN

## 2020-12-29 MED ORDER — CEFAZOLIN SODIUM-DEXTROSE 2-4 GM/100ML-% IV SOLN
INTRAVENOUS | Status: AC
  Filled 2020-12-29: qty 100

## 2020-12-29 MED ORDER — SODIUM CHLORIDE 0.9 % IV SOLN
INTRAVENOUS | Status: AC
  Filled 2020-12-29: qty 2

## 2020-12-29 MED ORDER — CEFAZOLIN SODIUM-DEXTROSE 2-3 GM-%(50ML) IV SOLR: INTRAVENOUS | Status: DC | PRN

## 2020-12-29 MED ORDER — LIDOCAINE HCL (PF) 1 % IJ SOLN
INTRAMUSCULAR | Status: DC | PRN
  Administered 2020-12-29: 60 mL

## 2020-12-29 MED ORDER — SODIUM CHLORIDE 0.9 % IV SOLN
80.0000 mg | INTRAVENOUS | Status: AC
  Administered 2020-12-29: 80 mg
  Filled 2020-12-29: qty 2

## 2020-12-29 SURGICAL SUPPLY — 5 items
CABLE SURGICAL S-101-97-12 (CABLE) ×2 IMPLANT
IPG PACE AZUR XT DR MRI W1DR01 (Pacemaker) IMPLANT
PACE AZURE XT DR MRI W1DR01 (Pacemaker) ×2 IMPLANT
PAD PRO RADIOLUCENT 2001M-C (PAD) ×2 IMPLANT
TRAY PACEMAKER INSERTION (PACKS) ×2 IMPLANT

## 2020-12-29 NOTE — Discharge Instructions (Signed)

## 2020-12-29 NOTE — H&P (Signed)
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    He also has a history of symptomatic PVCs which have been ameliorated by the pacing.   History of staph aureus bacteremia 2018  2/2 neck surgery The patient denies chest pain, shortness of breath, nocturnal dyspnea, orthopnea or peripheral edema.  There have been no palpitations, lightheadedness or syncope.  Somewhat surprisingly he has been unaware of reversion to VVI mode.   Reviewed his sleep study report.  AHI was 9.  Weight is up about 20 pounds this year   Date Cr K Hgb  11/19 1.1 4.2 16.1  11/20  1.1 4.9 15.7  12/21 1.1 4.6 Admitted      DATE TEST EF    1/18 TEE  55-60 %                         Past Medical History:  Diagnosis Date   Basal cell carcinoma (BCC) of cheek 05/2011    RIGHT-REMOVED   Cancer ([place])     Complication of anesthesia     Hyperlipidemia     Internal hemorrhoids     Lumbar disc disease     Presence of permanent cardiac pacemaker      medtronic Oct 2010   PVC (premature ventricular contraction)     Sinus node dysfunction [facility])             Past Surgical History:  Procedure Laterality Date   ANAL FISSURE REPAIR WITH PARTIAL LATERAL SPHINCTEROTOMY   1998   BACK SURGERY   12/18/2002   CHOLECYSTECTOMY   01/2003   COLONOSCOPY   10/19/2012   COLONOSCOPY WITH PROPOFOL N/A 11/25/2017    Procedure: COLONOSCOPY WITH PROPOFOL;  Surgeon: Bob Silvas, MD;  Location: [facility] ENDOSCOPY;  Service: Endoscopy;  Laterality: N/A;   ESOPHAGOSCOPY N/A 03/23/2016    Procedure: ESOPHAGOSCOPY;  Surgeon: Bob Gala, MD;  Location: [place];  Service: ENT;  Laterality: N/A;   EXCISIONAL HEMORRHOIDECTOMY        WITH FISTULECTOMY   INCISION AND DRAINAGE PERIRECTAL  ABSCESS   11/2003   INSERT / REPLACE / REMOVE PACEMAKER       INSERT/REPLACE/REMOVE PACEMAKER   01/24/2009   NECK SURGERY       PLACEMENT OF SETON   07/21/2015   TEE WITHOUT CARDIOVERSION N/A 03/30/2016    Procedure: TRANSESOPHAGEAL ECHOCARDIOGRAM (TEE);  Surgeon: Bob Klein, MD;  Location: [facility] ENDOSCOPY;  Service: Cardiovascular;  Laterality: N/A;   WOUND EXPLORATION N/A 03/23/2016    Procedure: Incision and Drainage of Cervical Wound;  Surgeon: Bob Cha, MD;  Location: [place];  Service: Neurosurgery;  Laterality: N/A;            Current Outpatient Medications  Medication Sig Dispense Refill   colestipol (COLESTID) 1 g tablet Take 2 g by mouth daily.                 Current Facility-Administered Medications  Medication Dose Route Frequency Provider Last Rate Last Admin   triamcinolone acetonide (KENALOG) 10 MG/ML injection 10 mg  10 mg Other Once Bob Schmidt, DPM  No Known Allergies         Review of Systems negative except from HPI and PMH   Physical Exam BP 138/74   Pulse 67   Ht 6\' 1"  (1.854 m)   Wt 240 lb 12.8 oz (109.2 kg)   SpO2 98%   BMI 31.77 kg/m  Well developed and well nourished in no acute distress HENT normal Neck supple with JVP-flat Clear Device pocket well healed; without hematoma or erythema.  There is no tethering  Regular rate and rhythm, no murmur Abd-soft with active BS No Clubbing cyanosis  edema Skin-warm and dry A & Oriented  Grossly normal sensory and motor function   ECG V pacing     Assessment and  Plan   SINUS NODE Dysfunction    Medtronic pacemaker    Nocturnal bradycardia   History of post cervical neck surgery MSSA infection-2018   Device has reached ERI.  Reverted to VVI.  Surprisingly asymptomatic.We have reviewed the benefits and risks of generator replacement.  These include but are not limited to lead fracture and infection.  The patient understands I have a patient who has reverted to VVI agrees and is  willing to proceed.     I am leaving town next week.  I have arranged for Bob Schmidt to do it on Monday  EP Attending  Patient seen and examined. Agree with above. Since prior clinic, no change. We will proceed with PM gen change out.    Bob Schmidt Bob Gadea,MD

## 2020-12-30 ENCOUNTER — Telehealth: Payer: Self-pay | Admitting: Internal Medicine

## 2020-12-30 NOTE — Telephone Encounter (Signed)
Patient wanted to speak to the Nurse regarding how to care for his wound. He had a PPM Gen Change yesterday. Please advise

## 2020-12-30 NOTE — Telephone Encounter (Signed)
Successful telephone encounter to patient to follow up on questions related to device gen change implant site. Patient is instructed remove clear outer dressing and leave steri-strips intact. He is instructed to not shower until wound check appointment 01/08/21. Confirmed 91 day ROV follow up with Dr. Caryl Comes 04/07/20. Remote transmission post gen change received yesterday. Patient is encouraged to call device clinic if any abnormal wound symptoms should arise.

## 2021-01-05 MED FILL — Lidocaine HCl Local Inj 1%: INTRAMUSCULAR | Qty: 60 | Status: AC

## 2021-01-08 ENCOUNTER — Other Ambulatory Visit: Payer: Self-pay

## 2021-01-08 ENCOUNTER — Ambulatory Visit (INDEPENDENT_AMBULATORY_CARE_PROVIDER_SITE_OTHER): Payer: BC Managed Care – PPO

## 2021-01-08 DIAGNOSIS — I495 Sick sinus syndrome: Secondary | ICD-10-CM

## 2021-01-08 LAB — CUP PACEART INCLINIC DEVICE CHECK
Battery Remaining Longevity: 179 mo
Battery Voltage: 3.23 V
Brady Statistic AP VP Percent: 0.21 %
Brady Statistic AP VS Percent: 24.79 %
Brady Statistic AS VP Percent: 0.06 %
Brady Statistic AS VS Percent: 74.94 %
Brady Statistic RA Percent Paced: 25.03 %
Brady Statistic RV Percent Paced: 0.27 %
Date Time Interrogation Session: 20221013144721
Implantable Lead Implant Date: 20101029
Implantable Lead Implant Date: 20101029
Implantable Lead Location: 753859
Implantable Lead Location: 753860
Implantable Lead Model: 5076
Implantable Lead Model: 5076
Implantable Pulse Generator Implant Date: 20221003
Lead Channel Impedance Value: 304 Ohm
Lead Channel Impedance Value: 323 Ohm
Lead Channel Impedance Value: 380 Ohm
Lead Channel Impedance Value: 399 Ohm
Lead Channel Pacing Threshold Amplitude: 0.5 V
Lead Channel Pacing Threshold Amplitude: 0.75 V
Lead Channel Pacing Threshold Pulse Width: 0.4 ms
Lead Channel Pacing Threshold Pulse Width: 0.4 ms
Lead Channel Sensing Intrinsic Amplitude: 1.25 mV
Lead Channel Sensing Intrinsic Amplitude: 2 mV
Lead Channel Sensing Intrinsic Amplitude: 4 mV
Lead Channel Sensing Intrinsic Amplitude: 4.25 mV
Lead Channel Setting Pacing Amplitude: 1.5 V
Lead Channel Setting Pacing Amplitude: 2 V
Lead Channel Setting Pacing Pulse Width: 0.4 ms
Lead Channel Setting Sensing Sensitivity: 2.8 mV

## 2021-01-08 NOTE — Progress Notes (Signed)
Wound check appointment. Steri-strips removed. Wound without redness or edema. Small hematoma noted. pt educated to call if increases in size. Incision edges approximated, wound well healed. Normal device function. Thresholds, sensing, and impedances consistent with implant measurements. Device programmed at nominal values. Histogram distribution appropriate for patient and level of activity. No mode switches or high ventricular rates noted. Patient educated about wound care, arm mobility. ROV 04/07/21 with SK.

## 2021-01-08 NOTE — Patient Instructions (Signed)
   After Your Pacemaker   Monitor your pacemaker site for redness, swelling, and drainage. Call the device clinic at [phone] if you experience these symptoms or fever/chills.  Your incision was closed with Steri-strips or staples:  You may shower 7 days after your procedure and wash your incision with soap and water. Avoid lotions, ointments, or perfumes over your incision until it is well-healed.  You may use a hot tub or a pool after your wound check appointment if the incision is completely closed.  You may drive, unless driving has been restricted by your healthcare providers.  Your Pacemaker is MRI compatible.  Remote monitoring is used to monitor your pacemaker from home. This monitoring is scheduled every 91 days by our office. It allows us to keep an eye on the functioning of your device to ensure it is working properly. You will routinely see your Electrophysiologist annually (more often if necessary).  

## 2021-02-03 ENCOUNTER — Ambulatory Visit (INDEPENDENT_AMBULATORY_CARE_PROVIDER_SITE_OTHER): Payer: BC Managed Care – PPO

## 2021-02-03 ENCOUNTER — Other Ambulatory Visit: Payer: Self-pay

## 2021-02-03 DIAGNOSIS — I495 Sick sinus syndrome: Secondary | ICD-10-CM

## 2021-02-03 NOTE — Progress Notes (Signed)
Patient presents to device clinic appointment for pacer wound check, complaining of hematoma at insertion site. Patient states wound has been red and inflamed for some time and draining serosanguinous drainage for 2 weeks. States he has not utilized any topical ointments and has only utilized sterile bandage coverings. Denies fever or chills. Dr. Quentin Ore in to assess site. Anticipate full system extraction. Patient will follow up with Dr. Lovena Le in clinic tomorrow for additional assessment and potential procedure scheduling.

## 2021-02-04 ENCOUNTER — Ambulatory Visit: Payer: BC Managed Care – PPO | Admitting: Internal Medicine

## 2021-02-04 VITALS — BP 108/68 | HR 58 | Ht 73.0 in | Wt 235.0 lb

## 2021-02-04 DIAGNOSIS — T827XXA Infection and inflammatory reaction due to other cardiac and vascular devices, implants and grafts, initial encounter: Secondary | ICD-10-CM | POA: Diagnosis not present

## 2021-02-04 DIAGNOSIS — Z950 Presence of cardiac pacemaker: Secondary | ICD-10-CM

## 2021-02-04 DIAGNOSIS — I495 Sick sinus syndrome: Secondary | ICD-10-CM | POA: Diagnosis not present

## 2021-02-04 LAB — CBC WITH DIFFERENTIAL/PLATELET
Basophils Absolute: 0.1 10*3/uL (ref 0.0–0.2)
Basos: 1 %
EOS (ABSOLUTE): 0.3 10*3/uL (ref 0.0–0.4)
Eos: 3 %
Hematocrit: 47.3 % (ref 37.5–51.0)
Hemoglobin: 16 g/dL (ref 13.0–17.7)
Immature Grans (Abs): 0 10*3/uL (ref 0.0–0.1)
Immature Granulocytes: 0 %
Lymphocytes Absolute: 1.6 10*3/uL (ref 0.7–3.1)
Lymphs: 20 %
MCH: 28.5 pg (ref 26.6–33.0)
MCHC: 33.8 g/dL (ref 31.5–35.7)
MCV: 84 fL (ref 79–97)
Monocytes Absolute: 0.5 10*3/uL (ref 0.1–0.9)
Monocytes: 7 %
Neutrophils Absolute: 5.5 10*3/uL (ref 1.4–7.0)
Neutrophils: 69 %
Platelets: 169 10*3/uL (ref 150–450)
RBC: 5.61 x10E6/uL (ref 4.14–5.80)
RDW: 12.7 % (ref 11.6–15.4)
WBC: 8 10*3/uL (ref 3.4–10.8)

## 2021-02-04 LAB — BASIC METABOLIC PANEL
BUN/Creatinine Ratio: 10 (ref 10–24)
BUN: 12 mg/dL (ref 8–27)
CO2: 25 mmol/L (ref 20–29)
Calcium: 9.5 mg/dL (ref 8.6–10.2)
Chloride: 102 mmol/L (ref 96–106)
Creatinine, Ser: 1.17 mg/dL (ref 0.76–1.27)
Glucose: 113 mg/dL — ABNORMAL HIGH (ref 70–99)
Potassium: 4.4 mmol/L (ref 3.5–5.2)
Sodium: 140 mmol/L (ref 134–144)
eGFR: 70 mL/min/{1.73_m2} (ref >59)

## 2021-02-04 NOTE — Patient Instructions (Addendum)
Medication Instructions:  ?Your physician recommends that you continue on your current medications as directed. Please refer to the Current Medication list given to you today. ? ?Labwork: ?You will get lab work today:  CBC and BMP ? ?Testing/Procedures: ?None ordered. ? ?Follow-Up: ? ?SEE INSTRUCTION LETTER ? ?Any Other Special Instructions Will Be Listed Below (If Applicable). ? ?If you need a refill on your cardiac medications before your next appointment, please call your pharmacy.  ? ? ? ? ? ?

## 2021-02-04 NOTE — H&P (View-Only) (Signed)
HPI Dr. Mia Creek returns today to discuss treatment of his PPM infection. 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 He developed a staph abscess with bacteremia in 2017 and underwent drainage and 5 weeks of IV anti-biotics and had no recurrent infectious symptoms. He underwent PPM gen change out about 6 weeks ago as his device was at [facility]. He developed a small hematoma and then presented yesterday with an open draining incision. No systemic symptoms. He is here to discuss additional treatment options. He has not had fever or any other systemic symptoms.  No Known Allergies   Current Outpatient Medications  Medication Sig Dispense Refill   colestipol (COLESTID) 1 g tablet Take 2 g by mouth daily.     tetrahydrozoline-zinc (VISINE-AC) 0.05-0.25 % ophthalmic solution Place 2 drops into both eyes 3 (three) times daily as needed (eye allergies).     Current Facility-Administered Medications  Medication Dose Route Frequency Provider Last Rate Last Admin   triamcinolone acetonide (KENALOG) 10 MG/ML injection 10 mg  10 mg Other Once Landis Martins, DPM         Past Medical History:  Diagnosis Date   Basal cell carcinoma (BCC) of cheek 05/2011   RIGHT-REMOVED   Cancer ([place])    Complication of anesthesia    Hyperlipidemia    Internal hemorrhoids    Lumbar disc disease    Presence of permanent cardiac pacemaker    medtronic Oct 2010   PVC (premature ventricular contraction)    Sinus node dysfunction (HCC)     ROS:   All systems reviewed and negative except as noted in the HPI.   Past Surgical History:  Procedure Laterality Date   ANAL FISSURE REPAIR WITH PARTIAL LATERAL SPHINCTEROTOMY  1998   BACK SURGERY  12/18/2002   CHOLECYSTECTOMY  01/2003   COLONOSCOPY  10/19/2012   COLONOSCOPY WITH PROPOFOL N/A 11/25/2017   Procedure: COLONOSCOPY WITH PROPOFOL;  Surgeon: Manya Silvas, MD;  Location: [facility]  ENDOSCOPY;  Service: Endoscopy;  Laterality: N/A;   ESOPHAGOSCOPY N/A 03/23/2016   Procedure: ESOPHAGOSCOPY;  Surgeon: Izora Gala, MD;  Location: [place];  Service: ENT;  Laterality: N/A;   EXCISIONAL HEMORRHOIDECTOMY     WITH FISTULECTOMY   INCISION AND DRAINAGE PERIRECTAL ABSCESS  11/2003   INSERT / REPLACE / REMOVE PACEMAKER     INSERT/REPLACE/REMOVE PACEMAKER  01/24/2009   NECK SURGERY     PLACEMENT OF SETON  07/21/2015   PPM GENERATOR CHANGEOUT N/A 12/29/2020   Procedure: PPM GENERATOR CHANGEOUT;  Surgeon: Evans Swab, MD;  Location: [place] CV LAB;  Service: Cardiovascular;  Laterality: N/A;   TEE WITHOUT CARDIOVERSION N/A 03/30/2016   Procedure: TRANSESOPHAGEAL ECHOCARDIOGRAM (TEE);  Surgeon: Sanda Klein, MD;  Location: [facility] ENDOSCOPY;  Service: Cardiovascular;  Laterality: N/A;   WOUND EXPLORATION N/A 03/23/2016   Procedure: Incision and Drainage of Cervical Wound;  Surgeon: Leeroy Cha, MD;  Location: [place];  Service: Neurosurgery;  Laterality: N/A;     Family History  Problem Relation Age of Onset   Sudden death Father    Diabetes type II Sister      Social History   Socioeconomic History   Marital status: Married    Spouse name: Not on file   Number of children: Not on file   Years of education: Not on file   Highest education level: Not on file  Occupational History   Not on  file  Tobacco Use   Smoking status: Never   Smokeless tobacco: Never  Vaping Use   Vaping Use: Never used  Substance and Sexual Activity   Alcohol use: Yes    Comment: SOCIALLY   Drug use: No   Sexual activity: Not on file  Other Topics Concern   Not on file  Social History Narrative   Lives at home with wife, independent at baseline. Is a physician.   Social Determinants of Health   Financial Resource Strain: Not on file  Food Insecurity: Not on file  Transportation Needs: Not on file  Physical Activity: Not on file  Stress: Not on file  Social Connections: Not on  file  Intimate Partner Violence: Not on file     BP 108/68   Pulse (!) 58   Ht 6\' 1"  (1.854 m)   Wt 235 lb (106.6 kg)   SpO2 98%   BMI 31.00 kg/m   Physical Exam:  Well appearing NAD HEENT: Unremarkable Neck:  No JVD, no thyromegally Lymphatics:  No adenopathy Back:  No CVA tenderness Lungs:  Clear with no wheezes; draining left chest incision. HEART:  Regular rate rhythm, no murmurs, no rubs, no clicks Abd:  soft, positive bowel sounds, no organomegally, no rebound, no guarding Ext:  2 plus pulses, no edema, no cyanosis, no clubbing Skin:  No rashes no nodules Neuro:  CN II through XII intact, motor grossly intact   Assess/Plan:  PPM wound infection -  Sinus node dysfunction -  Prior surgical infection, s/p drainage with staph bacteremia Remote peri-rectal abscess  Rec: the patient will need to undergo PPM system extraction. Anti-biotic therapy alone will not heal/cure his infection. Fortunately he is not device dependent. I have explained the indications/risks/benefits/goals/expectations of the procedure with the patient and his wife. He wishes to proceed.   Carleene Overlie Coleta Grosshans,MD

## 2021-02-04 NOTE — Progress Notes (Signed)
HPI Dr. Mia Creek returns today to discuss treatment of his PPM infection. 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 He developed a staph abscess with bacteremia in 2017 and underwent drainage and 5 weeks of IV anti-biotics and had no recurrent infectious symptoms. He underwent PPM gen change out about 6 weeks ago as his device was at [facility]. He developed a small hematoma and then presented yesterday with an open draining incision. No systemic symptoms. He is here to discuss additional treatment options. He has not had fever or any other systemic symptoms.  No Known Allergies   Current Outpatient Medications  Medication Sig Dispense Refill   colestipol (COLESTID) 1 g tablet Take 2 g by mouth daily.     tetrahydrozoline-zinc (VISINE-AC) 0.05-0.25 % ophthalmic solution Place 2 drops into both eyes 3 (three) times daily as needed (eye allergies).     Current Facility-Administered Medications  Medication Dose Route Frequency Provider Last Rate Last Admin   triamcinolone acetonide (KENALOG) 10 MG/ML injection 10 mg  10 mg Other Once Landis Martins, DPM         Past Medical History:  Diagnosis Date   Basal cell carcinoma (BCC) of cheek 05/2011   RIGHT-REMOVED   Cancer ([place])    Complication of anesthesia    Hyperlipidemia    Internal hemorrhoids    Lumbar disc disease    Presence of permanent cardiac pacemaker    medtronic Oct 2010   PVC (premature ventricular contraction)    Sinus node dysfunction (HCC)     ROS:   All systems reviewed and negative except as noted in the HPI.   Past Surgical History:  Procedure Laterality Date   ANAL FISSURE REPAIR WITH PARTIAL LATERAL SPHINCTEROTOMY  1998   BACK SURGERY  12/18/2002   CHOLECYSTECTOMY  01/2003   COLONOSCOPY  10/19/2012   COLONOSCOPY WITH PROPOFOL N/A 11/25/2017   Procedure: COLONOSCOPY WITH PROPOFOL;  Surgeon: Manya Silvas, MD;  Location: [facility]  ENDOSCOPY;  Service: Endoscopy;  Laterality: N/A;   ESOPHAGOSCOPY N/A 03/23/2016   Procedure: ESOPHAGOSCOPY;  Surgeon: Izora Gala, MD;  Location: [place];  Service: ENT;  Laterality: N/A;   EXCISIONAL HEMORRHOIDECTOMY     WITH FISTULECTOMY   INCISION AND DRAINAGE PERIRECTAL ABSCESS  11/2003   INSERT / REPLACE / REMOVE PACEMAKER     INSERT/REPLACE/REMOVE PACEMAKER  01/24/2009   NECK SURGERY     PLACEMENT OF SETON  07/21/2015   PPM GENERATOR CHANGEOUT N/A 12/29/2020   Procedure: PPM GENERATOR CHANGEOUT;  Surgeon: Evans Vi, MD;  Location: [place] [facility];  Service: Cardiovascular;  Laterality: N/A;   TEE WITHOUT CARDIOVERSION N/A 03/30/2016   Procedure: TRANSESOPHAGEAL ECHOCARDIOGRAM (TEE);  Surgeon: Sanda Klein, MD;  Location: [facility] ENDOSCOPY;  Service: Cardiovascular;  Laterality: N/A;   WOUND EXPLORATION N/A 03/23/2016   Procedure: Incision and Drainage of Cervical Wound;  Surgeon: Leeroy Cha, MD;  Location: [place];  Service: Neurosurgery;  Laterality: N/A;     Family History  Problem Relation Age of Onset   Sudden death Father    Diabetes type II Sister      Social History   Socioeconomic History   Marital status: Married    Spouse name: Not on file   Number of children: Not on file   Years of education: Not on file   Highest education level: Not on file  Occupational History   Not on  file  Tobacco Use   Smoking status: Never   Smokeless tobacco: Never  Vaping Use   Vaping Use: Never used  Substance and Sexual Activity   Alcohol use: Yes    Comment: SOCIALLY   Drug use: No   Sexual activity: Not on file  Other Topics Concern   Not on file  Social History Narrative   Lives at home with wife, independent at baseline. Is a physician.   Social Determinants of Health   Financial Resource Strain: Not on file  Food Insecurity: Not on file  Transportation Needs: Not on file  Physical Activity: Not on file  Stress: Not on file  Social Connections: Not on  file  Intimate Partner Violence: Not on file     BP 108/68   Pulse (!) 58   Ht 6\' 1"  (1.854 m)   Wt 235 lb (106.6 kg)   SpO2 98%   BMI 31.00 kg/m   Physical Exam:  Well appearing NAD HEENT: Unremarkable Neck:  No JVD, no thyromegally Lymphatics:  No adenopathy Back:  No CVA tenderness Lungs:  Clear with no wheezes; draining left chest incision. HEART:  Regular rate rhythm, no murmurs, no rubs, no clicks Abd:  soft, positive bowel sounds, no organomegally, no rebound, no guarding Ext:  2 plus pulses, no edema, no cyanosis, no clubbing Skin:  No rashes no nodules Neuro:  CN II through XII intact, motor grossly intact   Assess/Plan:  PPM wound infection -  Sinus node dysfunction -  Prior surgical infection, s/p drainage with staph bacteremia Remote peri-rectal abscess  Rec: the patient will need to undergo PPM system extraction. Anti-biotic therapy alone will not heal/cure his infection. Fortunately he is not device dependent. I have explained the indications/risks/benefits/goals/expectations of the procedure with the patient and his wife. He wishes to proceed.   Carleene Overlie Aigner Horseman,MD

## 2021-02-05 ENCOUNTER — Encounter (HOSPITAL_COMMUNITY): Payer: Self-pay | Admitting: Internal Medicine

## 2021-02-05 ENCOUNTER — Other Ambulatory Visit: Payer: Self-pay

## 2021-02-05 NOTE — Progress Notes (Addendum)
[facility] notified of pt procedure and time via secure message email. Aletha Halim, RN cc'd on email.   PCP - Dr. Ramonita Lab Cardiologist - Dr. Virl Axe EKG - 12/26/20 Chest x-ray -  ECHO -  Cardiac Cath -  CPAP -   Blood Thinner Instructions:  Aspirin Instructions:   Follow MD instructions for NPO status  COVID TEST- n/a  Anesthesia review: n/a  -------------  SDW INSTRUCTIONS:  Your procedure is scheduled on Friday 02/06/21. Please report to [facility] Main Entrance "A" at 12:30 PM., and check in at the Admitting office. Call this number if you have problems the morning of surgery: [phone]   Remember: You may have a LIGHT breakfast before 7:00 am (preferably just fluids).  Do NOT eat or drink after 7:00 am.   -   Medication instructions:  On the morning of your procedure you may take your normal morning medications with a sip of water.   Clear liquids allowed are: Water, Non-Citrus Juices (without pulp), Carbonated Beverages, Clear Tea, Black Coffee Only, and Gatorade    As of today, STOP taking any Aspirin (unless otherwise instructed by your surgeon), Aleve, Naproxen, Ibuprofen, Motrin, Advil, Goody's, BC's, all herbal medications, fish oil, and all vitamins.    The Morning of Surgery Do not wear jewelry Do not wear lotions, powders, colognes, or deodorant Do not shave 48 hours prior to surgery.   Men may shave face and neck. Do not bring valuables to the hospital. [facility] is not responsible for any belongings or valuables.  If you are a smoker, DO NOT Smoke 24 hours prior to surgery  If you wear a CPAP at night please bring your mask the morning of surgery   Remember that you must have someone to transport you home after your surgery, and remain with you for 24 hours if you are discharged the same day.  Please bring cases for contacts, glasses, hearing aids, dentures or bridgework because it cannot be worn into surgery.   Patients discharged the day  of surgery will not be allowed to drive home.   Please shower the NIGHT BEFORE/MORNING OF SURGERY (use antibacterial soap like DIAL soap if possible). Wear comfortable clothes the morning of surgery. Oral Hygiene is also important to reduce your risk of infection.  Remember - BRUSH YOUR TEETH THE MORNING OF SURGERY WITH YOUR REGULAR TOOTHPASTE  Patient denies shortness of breath, fever, cough and chest pain.

## 2021-02-06 ENCOUNTER — Ambulatory Visit (HOSPITAL_COMMUNITY): Payer: BC Managed Care – PPO

## 2021-02-06 ENCOUNTER — Other Ambulatory Visit: Payer: Self-pay

## 2021-02-06 ENCOUNTER — Encounter (HOSPITAL_COMMUNITY)
Admission: RE | Disposition: A | Discharge status: Home / Self Care | Payer: Self-pay | Source: Home / Self Care | Attending: Internal Medicine

## 2021-02-06 ENCOUNTER — Observation Stay (HOSPITAL_COMMUNITY)
Admission: RE | Admit: 2021-02-06 | Discharge: 2021-02-07 | Disposition: A | Discharge status: Home / Self Care | Payer: BC Managed Care – PPO | Attending: Internal Medicine | Admitting: Internal Medicine

## 2021-02-06 ENCOUNTER — Ambulatory Visit (HOSPITAL_COMMUNITY): Payer: BC Managed Care – PPO | Admitting: Anesthesiology

## 2021-02-06 ENCOUNTER — Encounter (HOSPITAL_COMMUNITY): Payer: Self-pay | Admitting: Internal Medicine

## 2021-02-06 DIAGNOSIS — Z8614 Personal history of Methicillin resistant Staphylococcus aureus infection: Secondary | ICD-10-CM | POA: Insufficient documentation

## 2021-02-06 DIAGNOSIS — T827XXA Infection and inflammatory reaction due to other cardiac and vascular devices, implants and grafts, initial encounter: Principal | ICD-10-CM | POA: Diagnosis present

## 2021-02-06 DIAGNOSIS — Z20822 Contact with and (suspected) exposure to covid-19: Secondary | ICD-10-CM | POA: Insufficient documentation

## 2021-02-06 DIAGNOSIS — I498 Other specified cardiac arrhythmias: Secondary | ICD-10-CM | POA: Diagnosis not present

## 2021-02-06 DIAGNOSIS — Z9889 Other specified postprocedural states: Secondary | ICD-10-CM | POA: Diagnosis not present

## 2021-02-06 DIAGNOSIS — Z85828 Personal history of other malignant neoplasm of skin: Secondary | ICD-10-CM | POA: Insufficient documentation

## 2021-02-06 DIAGNOSIS — Y712 Prosthetic and other implants, materials and accessory cardiovascular devices associated with adverse incidents: Secondary | ICD-10-CM | POA: Insufficient documentation

## 2021-02-06 HISTORY — PX: GENERATOR REMOVAL: SHX5468

## 2021-02-06 LAB — ECHO INTRAOPERATIVE TEE
Height: 73 in
Weight: 3728 oz

## 2021-02-06 LAB — PREPARE RBC (CROSSMATCH)

## 2021-02-06 LAB — SARS CORONAVIRUS 2 BY RT PCR (HOSPITAL ORDER, PERFORMED IN ~~LOC~~ HOSPITAL LAB): SARS Coronavirus 2: NEGATIVE

## 2021-02-06 LAB — ABO/RH: ABO/RH(D): B POS

## 2021-02-06 SURGERY — REMOVAL, PULSE GENERATOR, ICD
Anesthesia: General | Site: Chest

## 2021-02-06 MED ORDER — SUGAMMADEX SODIUM 200 MG/2ML IV SOLN
INTRAVENOUS | Status: DC | PRN
  Administered 2021-02-06: 300 mg via INTRAVENOUS

## 2021-02-06 MED ORDER — HEPARIN SODIUM (PORCINE) 1000 UNIT/ML IJ SOLN
INTRAMUSCULAR | Status: AC
  Filled 2021-02-06: qty 1

## 2021-02-06 MED ORDER — FENTANYL CITRATE (PF) 100 MCG/2ML IJ SOLN
INTRAMUSCULAR | Status: DC | PRN
  Administered 2021-02-06: 50 ug via INTRAVENOUS
  Administered 2021-02-06: 150 ug via INTRAVENOUS
  Administered 2021-02-06: 50 ug via INTRAVENOUS

## 2021-02-06 MED ORDER — PROPOFOL 10 MG/ML IV BOLUS
INTRAVENOUS | Status: DC | PRN
  Administered 2021-02-06: 130 mg via INTRAVENOUS

## 2021-02-06 MED ORDER — ACETAMINOPHEN 325 MG PO TABS: 325.0000 mg | ORAL_TABLET | ORAL | Status: DC | PRN

## 2021-02-06 MED ORDER — ONDANSETRON HCL 4 MG/2ML IJ SOLN
INTRAMUSCULAR | Status: DC | PRN
  Administered 2021-02-06: 4 mg via INTRAVENOUS

## 2021-02-06 MED ORDER — ONDANSETRON HCL 4 MG/2ML IJ SOLN: 4.0000 mg | Freq: Four times a day (QID) | INTRAMUSCULAR | Status: DC | PRN

## 2021-02-06 MED ORDER — FENTANYL CITRATE (PF) 250 MCG/5ML IJ SOLN
INTRAMUSCULAR | Status: AC
  Filled 2021-02-06: qty 5

## 2021-02-06 MED ORDER — LIDOCAINE 2% (20 MG/ML) 5 ML SYRINGE
INTRAMUSCULAR | Status: AC
  Filled 2021-02-06: qty 5

## 2021-02-06 MED ORDER — SODIUM CHLORIDE 0.9 % IV SOLN: INTRAVENOUS | Status: DC | PRN

## 2021-02-06 MED ORDER — SODIUM CHLORIDE 0.9 % IV SOLN
INTRAVENOUS | Status: AC
  Filled 2021-02-06 (×2): qty 2

## 2021-02-06 MED ORDER — ORAL CARE MOUTH RINSE: 15.0000 mL | Freq: Once | OROMUCOSAL | Status: AC

## 2021-02-06 MED ORDER — CEFAZOLIN SODIUM-DEXTROSE 2-4 GM/100ML-% IV SOLN
2.0000 g | INTRAVENOUS | Status: AC
  Administered 2021-02-06: 2 g via INTRAVENOUS

## 2021-02-06 MED ORDER — CEFAZOLIN SODIUM-DEXTROSE 1-4 GM/50ML-% IV SOLN
1.0000 g | Freq: Four times a day (QID) | INTRAVENOUS | Status: AC
  Administered 2021-02-06 – 2021-02-07 (×3): 1 g via INTRAVENOUS
  Filled 2021-02-06 (×3): qty 50

## 2021-02-06 MED ORDER — ROCURONIUM BROMIDE 10 MG/ML (PF) SYRINGE
PREFILLED_SYRINGE | INTRAVENOUS | Status: DC | PRN
  Administered 2021-02-06: 100 mg via INTRAVENOUS

## 2021-02-06 MED ORDER — ONDANSETRON HCL 4 MG/2ML IJ SOLN
INTRAMUSCULAR | Status: AC
  Filled 2021-02-06: qty 2

## 2021-02-06 MED ORDER — ONDANSETRON HCL 4 MG/2ML IJ SOLN
INTRAMUSCULAR | Status: AC
  Filled 2021-02-06: qty 4

## 2021-02-06 MED ORDER — DEXAMETHASONE SODIUM PHOSPHATE 10 MG/ML IJ SOLN
INTRAMUSCULAR | Status: DC | PRN
  Administered 2021-02-06: 10 mg via INTRAVENOUS

## 2021-02-06 MED ORDER — PHENYLEPHRINE 40 MCG/ML (10ML) SYRINGE FOR IV PUSH (FOR BLOOD PRESSURE SUPPORT)
PREFILLED_SYRINGE | INTRAVENOUS | Status: AC
  Filled 2021-02-06: qty 10

## 2021-02-06 MED ORDER — SODIUM CHLORIDE 0.9% IV SOLUTION: Freq: Once | INTRAVENOUS | Status: DC

## 2021-02-06 MED ORDER — ROCURONIUM BROMIDE 10 MG/ML (PF) SYRINGE
PREFILLED_SYRINGE | INTRAVENOUS | Status: AC
  Filled 2021-02-06: qty 10

## 2021-02-06 MED ORDER — LACTATED RINGERS IV SOLN: INTRAVENOUS | Status: DC

## 2021-02-06 MED ORDER — CHLORHEXIDINE GLUCONATE 0.12 % MT SOLN
OROMUCOSAL | Status: AC
  Administered 2021-02-06: 15 mL via OROMUCOSAL
  Filled 2021-02-06: qty 15

## 2021-02-06 MED ORDER — SODIUM CHLORIDE 0.9 % IV SOLN
INTRAVENOUS | Status: AC
  Filled 2021-02-06: qty 2

## 2021-02-06 MED ORDER — POVIDONE-IODINE 10 % EX SWAB
2.0000 "application " | Freq: Once | CUTANEOUS | Status: AC
  Administered 2021-02-06: 2 via TOPICAL

## 2021-02-06 MED ORDER — SODIUM CHLORIDE 0.9 % IV SOLN
INTRAVENOUS | Status: DC
Start: 2021-02-06 — End: 2021-02-06

## 2021-02-06 MED ORDER — AMISULPRIDE (ANTIEMETIC) 5 MG/2ML IV SOLN: 10.0000 mg | Freq: Once | INTRAVENOUS | Status: DC | PRN

## 2021-02-06 MED ORDER — LACTATED RINGERS IV SOLN: INTRAVENOUS | Status: DC | PRN

## 2021-02-06 MED ORDER — CHLORHEXIDINE GLUCONATE 0.12 % MT SOLN: 15.0000 mL | Freq: Once | OROMUCOSAL | Status: AC

## 2021-02-06 MED ORDER — SODIUM CHLORIDE 0.9 % IV SOLN: INTRAVENOUS | Status: DC

## 2021-02-06 MED ORDER — PROTAMINE SULFATE 10 MG/ML IV SOLN
INTRAVENOUS | Status: AC
  Filled 2021-02-06: qty 5

## 2021-02-06 MED ORDER — CEFAZOLIN SODIUM-DEXTROSE 2-4 GM/100ML-% IV SOLN
INTRAVENOUS | Status: AC
  Filled 2021-02-06: qty 100

## 2021-02-06 MED ORDER — ACETAMINOPHEN 500 MG PO TABS
1000.0000 mg | ORAL_TABLET | Freq: Once | ORAL | Status: AC
  Administered 2021-02-06: 1000 mg via ORAL
  Filled 2021-02-06: qty 2

## 2021-02-06 MED ORDER — DEXAMETHASONE SODIUM PHOSPHATE 10 MG/ML IJ SOLN
INTRAMUSCULAR | Status: AC
  Filled 2021-02-06: qty 1

## 2021-02-06 MED ORDER — PROPOFOL 10 MG/ML IV BOLUS
INTRAVENOUS | Status: AC
  Filled 2021-02-06: qty 20

## 2021-02-06 MED ORDER — CELECOXIB 200 MG PO CAPS
200.0000 mg | ORAL_CAPSULE | Freq: Once | ORAL | Status: AC
  Administered 2021-02-06: 200 mg via ORAL
  Filled 2021-02-06: qty 1

## 2021-02-06 MED ORDER — DEXAMETHASONE SODIUM PHOSPHATE 10 MG/ML IJ SOLN
INTRAMUSCULAR | Status: AC
  Filled 2021-02-06: qty 2

## 2021-02-06 MED ORDER — PHENYLEPHRINE HCL-NACL 20-0.9 MG/250ML-% IV SOLN
INTRAVENOUS | Status: DC | PRN
  Administered 2021-02-06: 15 ug/min via INTRAVENOUS

## 2021-02-06 MED ORDER — ROCURONIUM BROMIDE 10 MG/ML (PF) SYRINGE
PREFILLED_SYRINGE | INTRAVENOUS | Status: AC
  Filled 2021-02-06: qty 30

## 2021-02-06 MED ORDER — FENTANYL CITRATE (PF) 100 MCG/2ML IJ SOLN: 25.0000 ug | INTRAMUSCULAR | Status: DC | PRN

## 2021-02-06 MED ORDER — PROMETHAZINE HCL 25 MG/ML IJ SOLN: 6.2500 mg | INTRAMUSCULAR | Status: DC | PRN

## 2021-02-06 MED ORDER — SODIUM CHLORIDE 0.9 % IV SOLN: 80.0000 mg | INTRAVENOUS | Status: DC

## 2021-02-06 MED ORDER — LIDOCAINE HCL (CARDIAC) PF 100 MG/5ML IV SOSY
PREFILLED_SYRINGE | INTRAVENOUS | Status: DC | PRN
  Administered 2021-02-06: 30 mg via INTRAVENOUS

## 2021-02-06 MED ORDER — CHLORHEXIDINE GLUCONATE 4 % EX LIQD: 4.0000 "application " | Freq: Once | CUTANEOUS | Status: DC

## 2021-02-06 MED ORDER — MIDAZOLAM HCL 5 MG/5ML IJ SOLN
INTRAMUSCULAR | Status: DC | PRN
  Administered 2021-02-06: 2 mg via INTRAVENOUS

## 2021-02-06 MED ORDER — MIDAZOLAM HCL 2 MG/2ML IJ SOLN
INTRAMUSCULAR | Status: AC
  Filled 2021-02-06: qty 2

## 2021-02-06 SURGICAL SUPPLY — 30 items
BAG BANDED W/RUBBER/TAPE 36X54 (MISCELLANEOUS) ×2 IMPLANT
BAG COUNTER SPONGE SURGICOUNT (BAG) ×2 IMPLANT
CANISTER SUCT 3000ML PPV (MISCELLANEOUS) ×2 IMPLANT
COIL ONE TIE COMPRESSION (VASCULAR PRODUCTS) ×4 IMPLANT
DRAPE CARDIOVASCULAR INCISE (DRAPES) ×2
DRAPE HALF SHEET 40X57 (DRAPES) ×4 IMPLANT
DRAPE INCISE IOBAN 66X45 STRL (DRAPES) ×2 IMPLANT
DRAPE SRG 135X102X78XABS (DRAPES) ×1 IMPLANT
DRSG TEGADERM 4X10 (GAUZE/BANDAGES/DRESSINGS) ×2 IMPLANT
ELECT REM PT RETURN 9FT ADLT (ELECTROSURGICAL) ×2
ELECTRODE REM PT RTRN 9FT ADLT (ELECTROSURGICAL) ×1 IMPLANT
GAUZE 4X4 16PLY ~~LOC~~+RFID DBL (SPONGE) ×4 IMPLANT
GAUZE SPONGE 4X4 12PLY STRL (GAUZE/BANDAGES/DRESSINGS) ×4 IMPLANT
GLOVE SURG LTX SZ8 (GLOVE) ×2 IMPLANT
GLOVE SURG UNDER POLY LF SZ7.5 (GLOVE) ×2 IMPLANT
GOWN STRL REUS W/ TWL LRG LVL3 (GOWN DISPOSABLE) ×2 IMPLANT
GOWN STRL REUS W/ TWL XL LVL3 (GOWN DISPOSABLE) ×1 IMPLANT
GOWN STRL REUS W/TWL LRG LVL3 (GOWN DISPOSABLE) ×4
GOWN STRL REUS W/TWL XL LVL3 (GOWN DISPOSABLE) ×2
KIT TURNOVER KIT B (KITS) ×2 IMPLANT
PAD ARMBOARD 7.5X6 YLW CONV (MISCELLANEOUS) ×4 IMPLANT
PENCIL BUTTON HOLSTER BLD 10FT (ELECTRODE) IMPLANT
SHEATH 11 SUB-C ROTATE DILATOR (SHEATH) ×2 IMPLANT
SHEATH TIGHTRAIL MECH 11F (SHEATH) ×2 IMPLANT
STYLET LIBERATOR LOCKING (MISCELLANEOUS) ×4 IMPLANT
SUT PROLENE 2 0 SH DA (SUTURE) ×2 IMPLANT
SWAB COLLECTION DEVICE MRSA (MISCELLANEOUS) ×2 IMPLANT
SWAB CULTURE ESWAB REG 1ML (MISCELLANEOUS) ×2 IMPLANT
TAPE CLOTH SURG 4X10 WHT LF (GAUZE/BANDAGES/DRESSINGS) ×2 IMPLANT
TOWEL GREEN STERILE FF (TOWEL DISPOSABLE) ×4 IMPLANT

## 2021-02-06 NOTE — Anesthesia Procedure Notes (Signed)
Procedure Name: Intubation Date/Time: 02/06/2021 1:15 PM Performed by: Eligha Bridegroom, CRNA Pre-anesthesia Checklist: Patient identified, Emergency Drugs available, Suction available, Patient being monitored and Timeout performed Patient Re-evaluated:Patient Re-evaluated prior to induction Oxygen Delivery Method: Circle system utilized Preoxygenation: Pre-oxygenation with 100% oxygen Induction Type: IV induction Laryngoscope Size: Mac and 4 Grade View: Grade II Tube type: Oral Tube size: 7.5 mm Number of attempts: 1 Airway Equipment and Method: Stylet Placement Confirmation: ETT inserted through vocal cords under direct vision, positive ETCO2 and breath sounds checked- equal and bilateral Secured at: 23 cm Tube secured with: Tape Dental Injury: Teeth and Oropharynx as per pre-operative assessment

## 2021-02-06 NOTE — Transfer of Care (Signed)
Immediate Anesthesia Transfer of Care Note  Patient: Bob Chiquito, MD  Procedure(s) Performed: PACEMAKER SYSTEM REMOVAL (Chest)  Patient Location: PACU  Anesthesia Type:General  Level of Consciousness: awake, alert  and oriented  Airway & Oxygen Therapy: Patient Spontanous Breathing  Post-op Assessment: Report given to RN and Post -op Vital signs reviewed and stable  Post vital signs: Reviewed and stable  Last Vitals:  Vitals Value Taken Time  BP 114/65 02/06/21 1515  Temp    Pulse 68 02/06/21 1523  Resp 13 02/06/21 1523  SpO2 97 % 02/06/21 1523  Vitals shown include unvalidated device data.  Last Pain:  Vitals:   02/06/21 1156  TempSrc:   PainSc: 0-No pain         Complications: No notable events documented.

## 2021-02-06 NOTE — CV Procedure (Signed)
EP procedure note  Procedure performed: Extraction of a dual-chamber pacemaker  Preoperative diagnosis: Pacemaker system infection  Postoperative diagnosis: Same as preoperative diagnosis  Description of the procedure: After informed consent was obtained, the patient was taken to the operating room in the fasting state.  The anesthesia service was utilized to provide general endotracheal anesthesia as well as invasive hemodynamic monitoring with a right radial art line.  A 6 French sheath was inserted percutaneously in the right femoral vein.  At this point attention was turned to removal of the patient's infected pacing system.  A 6 cm ellipse incision was carried out.  A combination of sharp dissection and electrocautery was utilized to dissect down to the pacemaker pocket.  The pocket was noted to have serous fluid within but no clear-cut purulence.  Cultures were obtained.  Electrocautery was utilized to free up the leads from there are dense fibrous scar tissue and the pacemaker generator was disconnected from the old leads.  Electrocautery was utilized to dissect down to the sewing sleeves which were also freed up.  A 58 cm stylette was inserted into both atrial and ventricular leads and the helix was attempted to be retracted.  The stylette was removed and the leads were cut.  Attention was first turned to the ventricular lead.  A liberator locking stylette was advanced into the right ventricular lead and locked in place.  A Cook 1 tie locking stylette was connected to the proximal portion of the lead and the locking stylette.  The [employer]etics 47 [place] sub-C extraction sheath was then advanced over the lead and into the subclavian vein and down into the innominate vein.  The 11 [place] Spectranetics extraction sheath was removed and the longer [employer]etics 11 [place] extraction sheath was advanced over the lead body down to the right atrial right ventricular junction and the pacing lead was  removed in total with no hemodynamic sequelae.  Attention was then turned to the atrial lead.  After the stylette was inserted into the body of the lead and the helix retracted, the stylette was removed and a Cook liberator locking stylette was advanced into the body of the lead down to the tip.  The Cook locking stylette was locked in place.  The [place] 1 tie was attached to the proximal portion of the lead and the locking stylette.  The liberator was locked in place.  The [employer] 57 [place] sub-C short extraction sheath was then advanced over the atrial lead to the junction of the subclavian and innominate veins.  The [employer] 47 [place] longer sheath was then advanced over the body of the lead.  A combination of traction countertraction, pressure and counterpressure was utilized and the atrial lead was removed with no hemodynamic sequelae.  Hemostasis was obtained.  Attention was then turned to the pocket where the necrotic tissue was removed from the pocket and electrocautery utilized to assure hemostasis.  The pocket was irrigated with antibiotic irrigation.  The incision was closed with multiple 2-0 Prolene mattress sutures.  Pressure was held, and a pressure dressing applied.  The 6 French sheath was removed and hemostasis obtained in the right femoral vein.  The patient was returned to recovery area in stable condition.  Complications: There were no immediate procedure complication  Conclusion: Successful extraction of 65 year old dual-chamber pacing leads in a patient with pacemaker pacing system infection with no immediate procedural complication.  Carleene Overlie Galan Ghee,MD

## 2021-02-06 NOTE — Interval H&P Note (Signed)
History and Physical Interval Note:  02/06/2021 12:14 PM  [name], MD  has presented today for surgery, with the diagnosis of PACEMAKER INFECTION.  The various methods of treatment have been discussed with the patient and family. After consideration of risks, benefits and other options for treatment, the patient has consented to  Procedure(s): [place] (N/A) as a surgical intervention.  [name] history has been reviewed, patient examined, no change in status, stable for surgery.  I have reviewed the patient's chart and labs.  Questions were answered to the patient's satisfaction.     Bob Schmidt

## 2021-02-06 NOTE — Discharge Instructions (Signed)
Post procedure wound care instructions Keep incision clean and dry, no showers until cleared to at the office/sutures removed and cleared to No driving until cleared to at your follow up visit Call the office [phone]) for redness, drainage, swelling, or fever.

## 2021-02-06 NOTE — Anesthesia Postprocedure Evaluation (Signed)
Anesthesia Post Note  Patient: [name], MD  Procedure(s) Performed: PACEMAKER SYSTEM REMOVAL (Chest)     Anesthesia Post Evaluation No notable events documented.  Last Vitals:  Vitals:   02/06/21 1614 02/06/21 1629  BP: 106/66 109/66  Pulse: (!) 59 62  Resp: 13 14  Temp:  (P) 36.4 C  SpO2: 95% 99%    Last Pain:  Vitals:   02/06/21 1545  TempSrc:   PainSc: 0-No pain                 Delayni Streed DANIEL

## 2021-02-06 NOTE — Anesthesia Procedure Notes (Signed)
Arterial Line Insertion Start/End11/01/2021 12:09 PM, 02/06/2021 12:09 PM Performed by: CRNA  Patient location: Pre-op. Preanesthetic checklist: patient identified, IV checked, site marked, risks and benefits discussed, surgical consent, monitors and equipment checked, pre-op evaluation, timeout performed and anesthesia consent Lidocaine 1% used for infiltration Right, radial was placed Catheter size: 20 G Hand hygiene performed , maximum sterile barriers used  and Seldinger technique used Allen's test indicative of satisfactory collateral circulation Attempts: 1 Procedure performed without using ultrasound guided technique. Following insertion, dressing applied and Biopatch. Post procedure assessment: normal  Patient tolerated the procedure well with no immediate complications.

## 2021-02-06 NOTE — Anesthesia Preprocedure Evaluation (Addendum)
Anesthesia Evaluation  Patient identified by MRN, date of birth, ID band Patient awake    Reviewed: Allergy & Precautions, NPO status , Patient's Chart, lab work & pertinent test results  Airway Mallampati: III  TM Distance: >3 FB Neck ROM: Full    Dental no notable dental hx. (+) Dental Advisory Given   Pulmonary neg pulmonary ROS,    Pulmonary exam normal        Cardiovascular Normal cardiovascular exam+ pacemaker   Echo 2018 Study Conclusions   - Left ventricle: The cavity size was normal. Wall thickness was  normal. Systolic function was normal. The estimated ejection  fraction was in the range of 55% to 60%. Wall motion was normal;  there were no regional wall motion abnormalities.  - Mitral valve: There was mild regurgitation.  - Left atrium: No evidence of thrombus in the atrial cavity or  appendage.  - Right atrium: No evidence of thrombus in the atrial cavity or  appendage.  - Atrial septum: No defect or patent foramen ovale was identified.   Impressions:   - No evidence of endocarditis. There was no evidence of a  vegetation on the heart valves or the visualized segments of the  pacemaker leads.    Neuro/Psych negative neurological ROS     GI/Hepatic negative GI ROS, Neg liver ROS,   Endo/Other  negative endocrine ROS  Renal/GU negative Renal ROS     Musculoskeletal negative musculoskeletal ROS (+)   Abdominal   Peds  Hematology negative hematology ROS (+)   Anesthesia Other Findings   Reproductive/Obstetrics                            Anesthesia Physical Anesthesia Plan  ASA: 3  Anesthesia Plan: General   Post-op Pain Management:    Induction: Intravenous  PONV Risk Score and Plan: 3 and Ondansetron, Dexamethasone and Midazolam  Airway Management Planned: Oral ETT  Additional Equipment: Arterial line  Intra-op Plan:   Post-operative Plan:  Extubation in OR  Informed Consent:   Plan Discussed with: Anesthesiologist  Anesthesia Plan Comments:         Anesthesia Quick Evaluation

## 2021-02-07 ENCOUNTER — Observation Stay (HOSPITAL_COMMUNITY): Payer: BC Managed Care – PPO

## 2021-02-07 ENCOUNTER — Encounter (HOSPITAL_COMMUNITY): Payer: Self-pay | Admitting: Internal Medicine

## 2021-02-07 DIAGNOSIS — Z20822 Contact with and (suspected) exposure to covid-19: Secondary | ICD-10-CM | POA: Diagnosis not present

## 2021-02-07 DIAGNOSIS — I498 Other specified cardiac arrhythmias: Secondary | ICD-10-CM | POA: Diagnosis not present

## 2021-02-07 DIAGNOSIS — R001 Bradycardia, unspecified: Secondary | ICD-10-CM

## 2021-02-07 DIAGNOSIS — Z85828 Personal history of other malignant neoplasm of skin: Secondary | ICD-10-CM | POA: Diagnosis not present

## 2021-02-07 DIAGNOSIS — T827XXD Infection and inflammatory reaction due to other cardiac and vascular devices, implants and grafts, subsequent encounter: Secondary | ICD-10-CM

## 2021-02-07 DIAGNOSIS — T827XXA Infection and inflammatory reaction due to other cardiac and vascular devices, implants and grafts, initial encounter: Secondary | ICD-10-CM | POA: Diagnosis not present

## 2021-02-07 MED ORDER — MENTHOL 3 MG MT LOZG
1.0000 | LOZENGE | OROMUCOSAL | Status: DC | PRN
  Administered 2021-02-07: 3 mg via ORAL
  Filled 2021-02-07: qty 9

## 2021-02-07 MED ORDER — DOXYCYCLINE HYCLATE 100 MG PO CAPS: 100.0000 mg | ORAL_CAPSULE | Freq: Two times a day (BID) | ORAL | 0 refills | Status: DC

## 2021-02-07 NOTE — Discharge Summary (Signed)
Discharge Summary    Patient ID: Bob SMALLS, MD MRN: [id number]; DOB: [birth year]  Admit date: 02/06/2021 Discharge date: 02/07/2021  PCP:  Adin Hector, MD   [facility] Cardiologist:  Virl Axe, MD        Discharge Diagnoses    Active Problems:   Pacemaker infection [facility])    Diagnostic Studies/Procedures    Pacemaker System Extraction by Dr. Cristopher Peru on 02/06/2021. 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  He developed a staph abscess in 2017 s/p drainage and 5 weeks of IV antibiotics. He underwent a gen change 12/29/20 c/b a small hematoma.  He then developed an open draining incision.  He saw Dr. Lovena Le in the office on 11/9 and recommended proceeding with pacemaker system extraction.    Hospital Course     Consultants: None    The patient was admitted to the hospital yesterday and underwent extraction of his dual chamber pacemaker by Dr. Lovena Le.  There were no immediate complications.  His CXR this AM did not show any pneumothorax.  He was seen by Dr. Rayann Heman this morning.  He is doing well and is felt to be stable for discharge to home.  He will be DC on Doxycycline 100 mg twice daily for 10 days.  He has a wound check appt 11/22 and a f/u with Dr. Caryl Comes 12/14.    Did the patient have an acute coronary syndrome (MI, NSTEMI, STEMI, etc) this admission?:  No                               Did the patient have a percutaneous coronary intervention (stent / angioplasty)?:  No.          _____________  Discharge Vitals Blood pressure (!) 99/54, pulse 60, temperature 98.1 F (36.7 C), temperature source Oral, resp. rate 18, height 6\' 1"  (1.854 m), weight 106.2 kg, SpO2 97 %.  Filed Weights   02/05/21 1018 02/06/21 1113 02/06/21 1900  Weight: 106.1 kg 105.7 kg 106.2 kg    Labs & Radiologic Studies     _____________  DG Chest 2 View   Result Date: 02/07/2021 CLINICAL DATA:  65 year old male with history of pacemaker infection. EXAM: CHEST - 2 VIEW COMPARISON:  Chest x-ray 04/29/2016. FINDINGS: Previously noted left-sided pacemaker has been removed. Lung volumes are normal. No consolidative airspace disease. No pleural effusions. No pneumothorax. No pulmonary nodule or mass noted. Pulmonary vasculature and the cardiomediastinal silhouette are within normal limits. Orthopedic fixation hardware in the lower cervical spine incidentally noted.  IMPRESSION: 1. Status post removal of left-sided pacemaker device with no acute complicating features. Specifically, no pneumothorax. Electronically Signed   By: Vinnie Langton M.D.   On: 02/07/2021 08:41    Disposition   Pt is being discharged home today in good condition.  Follow-up Plans & Appointments     Follow-up Information     Shirley Friar, PA-C Follow up.   Specialty: Physician Assistant Why: 03/19/21 @ 12:20, wound check, suture removal Contact information: [address] [place] [address] [phone]                Discharge Instructions     Call MD for:  redness, tenderness, or signs of infection (pain, swelling, redness, odor or  green/yellow discharge around incision site)   Complete by: As directed    Call MD for:  temperature >100.4   Complete by: As directed    Diet - low sodium heart healthy   Complete by: As directed    Discharge wound care:   Complete by: As directed    See Patient Instructions on AVS   Driving Restrictions   Complete by: As directed    No driving until cleared at your follow up appointment.   Increase activity slowly   Complete by: As directed        Discharge Medications   Allergies as of 02/07/2021   No Known Allergies      Medication List     TAKE these medications    colestipol 1 g tablet Commonly known as: COLESTID Take 2 g by mouth daily.   doxycycline 100 MG capsule Commonly  known as: VIBRAMYCIN Take 1 capsule (100 mg total) by mouth 2 (two) times daily for 10 days.   tetrahydrozoline-zinc 0.05-0.25 % ophthalmic solution Commonly known as: VISINE-AC Place 2 drops into both eyes 3 (three) times daily as needed (eye allergies).               Discharge Care Instructions  (From admission, onward)           Start     Ordered   02/07/21 0000  Discharge wound care:       Comments: See Patient Instructions on AVS   02/07/21 1033               Outstanding Labs/Studies   None   Duration of Discharge Encounter   Greater than 30 minutes including physician time.  Signed, Richardson Dopp, PA-C 02/07/2021, 10:34 AM

## 2021-02-07 NOTE — Progress Notes (Signed)
   Progress Note   Subjective   Doing well today, the patient denies CP or SOB.  No new concerns  Inpatient Medications    Scheduled Meds:  sodium chloride   Intravenous Once   sodium chloride   Intravenous Once   Continuous Infusions:  PRN Meds: acetaminophen, menthol-cetylpyridinium, ondansetron (ZOFRAN) IV   Vital Signs    Vitals:   02/06/21 1900 02/06/21 2336 02/07/21 0428 02/07/21 0808  BP: 111/64 101/63 108/68 (!) 99/54  Pulse: 71 63 66 60  Resp: (!) 9 17 18 18   Temp: 98.1 F (36.7 C) 98.7 F (37.1 C) 98.5 F (36.9 C) 98.1 F (36.7 C)  TempSrc: Oral Oral Oral Oral  SpO2: 97% 92% 95% 97%  Weight: 106.2 kg     Height:        Intake/Output Summary (Last 24 hours) at 02/07/2021 0824 Last data filed at 02/07/2021 0430 Gross per 24 hour  Intake 1690 ml  Output 1525 ml  Net 165 ml   Filed Weights   02/05/21 1018 02/06/21 1113 02/06/21 1900  Weight: 106.1 kg 105.7 kg 106.2 kg    Telemetry    Sinus bradycardia,  heart rates in the 50s overnight,  no pauses or profound brady events - Personally Reviewed  Physical Exam   GEN- The patient is well appearing, alert and oriented x 3 today.   Head- normocephalic, atraumatic Eyes-  Sclera clear, conjunctiva pink Ears- hearing intact Oropharynx- clear Neck- supple, Lungs-  normal work of breathing Heart- Regular rate and rhythm  GI- soft  Extremities- no clubbing, cyanosis, or edema  MS- no significant deformity or atrophy Skin- wound is clean and dry,  no hematoma or drainage,  stitches in tact   Labs    Chemistry Recent Labs  Lab 02/04/21 0910  NA 140  K 4.4  CL 102  CO2 25  GLUCOSE 113*  BUN 12  CREATININE 1.17  CALCIUM 9.5     Hematology Recent Labs  Lab 02/04/21 0910  WBC 8.0  RBC 5.61  HGB 16.0  HCT 47.3  MCV 84  MCH 28.5  MCHC 33.8  RDW 12.7  PLT 169    Assessment & Plan    1.  PPM wound infection Doing well s/p extraction yesterday.  Per Dr Lovena Le will plan discharge  today with doxycycline 100mg  BID x 10 days. He has scheduled follow-up  2. Sick sinus Previously implanted for nocturnal bradycardia No issues on telemetry while here. Patient and Dr Lovena Le are planning to avoid reimplantation if possible  DC to home  Thompson Grayer MD, [facility] 02/07/2021 8:24 AM

## 2021-02-08 LAB — URINE CULTURE: Culture: NO GROWTH

## 2021-02-09 LAB — TYPE AND SCREEN
ABO/RH(D): B POS
Antibody Screen: NEGATIVE
Unit division: 0
Unit division: 0
Unit division: 0
Unit division: 0

## 2021-02-09 LAB — BPAM RBC
Blood Product Expiration Date: 202211252359
Blood Product Expiration Date: 202211252359
Blood Product Expiration Date: 202212052359
Blood Product Expiration Date: 202212052359
ISSUE DATE / TIME: 202211111320
ISSUE DATE / TIME: 202211111320
Unit Type and Rh: 7300
Unit Type and Rh: 7300
Unit Type and Rh: 7300
Unit Type and Rh: 7300

## 2021-02-09 MED FILL — Gentamicin Sulfate Inj 40 MG/ML: INTRAMUSCULAR | Qty: 80 | Status: AC

## 2021-02-13 ENCOUNTER — Telehealth: Payer: Self-pay | Admitting: Internal Medicine

## 2021-02-13 NOTE — Telephone Encounter (Addendum)
Elyse from Microbiology called to report that the pts culture from his pacemaker is a little different than originally reported... report available in Epic...   Pt is seeing A Tillery 02/17/21  On discharge: (02/06/21)  He will be DC on Doxycycline 100 mg twice daily for 10 days.  He has a wound check appt 11/22 and a f/u with Dr. Caryl Comes 12/14.    Will forward to Dr. Lovena Le, Dr. Caryl Comes, and Kristin Bruins PA to review the report and determine if pt on the appropriate antibiotics.    Addendum:   Per Kristin Bruins PA Reviewed with clinical pharmacist Erin Hearing and Doxy should still be appropriate.

## 2021-02-13 NOTE — Telephone Encounter (Signed)
Elyse from the [employer] [facility] is wanting to speak with [facility] Nurse. [phone]

## 2021-02-14 LAB — AEROBIC/ANAEROBIC CULTURE W GRAM STAIN (SURGICAL/DEEP WOUND): Gram Stain: NONE SEEN

## 2021-02-16 NOTE — Progress Notes (Signed)
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    S/p device extraction 02/06/2021 in setting of infection. Discharged on Doxycycline 100 mg BID x 10 days. Called back for culture which showed "Rare Staph. Aureus and few Propionibacterium Acnes". Reviewed with Pharm-D who stated doxycycline was still appropriate.   Since discharge from hospital the patient reports doing well. He plans to return to work tomorrow. Wound well healed. Sutures removes in office today.  he denies chest pain, palpitations, dyspnea, PND, orthopnea, nausea, vomiting, dizziness, syncope, edema, weight gain, or early satiety.  Device History: Medtronic Dual Chamber PPM implanted 2010, gen change 10/3 for SSS/Tachy-brady EXPLANT 02/06/2021  Past Medical History:  Diagnosis Date   Basal cell carcinoma (BCC) of cheek 05/2011   RIGHT-REMOVED   Cancer ([place])    Hyperlipidemia    Internal hemorrhoids    Lumbar disc disease    Presence of permanent cardiac pacemaker    medtronic Oct 2010   PVC (premature ventricular contraction)    Sinus node dysfunction ([place])    Past Surgical History:  Procedure Laterality Date   ANAL FISSURE REPAIR WITH PARTIAL LATERAL SPHINCTEROTOMY  1998   BACK SURGERY  12/18/2002   CHOLECYSTECTOMY  01/2003   COLONOSCOPY  10/19/2012   COLONOSCOPY WITH PROPOFOL N/A 11/25/2017   Procedure: COLONOSCOPY WITH PROPOFOL;  Surgeon: Manya Silvas, MD;  Location: [facility] ENDOSCOPY;  Service: Endoscopy;  Laterality: N/A;   ESOPHAGOSCOPY N/A 03/23/2016   Procedure: ESOPHAGOSCOPY;  Surgeon: Izora Gala, MD;  Location: [place];  Service: ENT;  Laterality: N/A;   EXCISIONAL HEMORRHOIDECTOMY     WITH FISTULECTOMY    GENERATOR REMOVAL N/A 02/06/2021   Procedure: PACEMAKER SYSTEM REMOVAL;  Surgeon: Evans Koudelka, MD;  Location: [place];  Service: Cardiovascular;  Laterality: N/A;   [place]  11/2003   INSERT / REPLACE / REMOVE PACEMAKER     INSERT/REPLACE/REMOVE PACEMAKER  01/24/2009   NECK SURGERY     PLACEMENT OF [place]  07/21/2015   PPM GENERATOR CHANGEOUT N/A 12/29/2020   Procedure: PPM GENERATOR CHANGEOUT;  Surgeon: Evans Meacham, MD;  Location: [place] [facility];  Service: Cardiovascular;  Laterality: N/A;   TEE WITHOUT CARDIOVERSION N/A 03/30/2016   Procedure: TRANSESOPHAGEAL ECHOCARDIOGRAM (TEE);  Surgeon: Sanda Klein, MD;  Location: [facility] ENDOSCOPY;  Service: Cardiovascular;  Laterality: N/A;   WOUND EXPLORATION N/A 03/23/2016   Procedure: Incision and Drainage of Cervical Wound;  Surgeon: Leeroy Cha, MD;  Location: [place];  Service: Neurosurgery;  Laterality: N/A;    Current Outpatient Medications  Medication Sig Dispense Refill   colestipol (COLESTID) 1 g tablet Take 2 g by mouth daily.     doxycycline (VIBRAMYCIN) 100 MG capsule Take 1 capsule (100 mg total) by mouth 2 (two) times daily for 10 days. 20 capsule 0   tetrahydrozoline-zinc (VISINE-AC) 0.05-0.25 % ophthalmic solution Place 2 drops into both eyes 3 (three) times daily as needed (eye allergies).     Current Facility-Administered Medications  Medication Dose Route Frequency Provider Last Rate Last Admin   triamcinolone acetonide (KENALOG) 10 MG/ML injection 10 mg  10 mg Other Once Landis Martins, DPM        Allergies:  Patient has no known allergies.   Social History: Social History   Socioeconomic History   Marital status: Married    Spouse name: Not on file   Number of children: Not on file   Years of education: Not on file   Highest education level: Not on file  Occupational History   Not on file  Tobacco Use   Smoking status: Never   Smokeless tobacco: Never  Vaping Use    Vaping Use: Never used  Substance and Sexual Activity   Alcohol use: Not Currently    Comment: SOCIALLY   Drug use: No   Sexual activity: Not on file  Other Topics Concern   Not on file  Social History Narrative   Lives at home with wife, independent at baseline. Is a physician.   Social Determinants of Health   Financial Resource Strain: Not on file  Food Insecurity: Not on file  Transportation Needs: Not on file  Physical Activity: Not on file  Stress: Not on file  Social Connections: Not on file  Intimate Partner Violence: Not on file    Family History: Family History  Problem Relation Age of Onset   Sudden death Father    Diabetes type II Sister      Review of Systems: All other systems reviewed and are otherwise negative except as noted above.  Physical Exam: There were no vitals filed for this visit.   GEN- The patient is well appearing, alert and oriented x 3 today.   HEENT: normocephalic, atraumatic; sclera clear, conjunctiva pink; hearing intact; oropharynx clear; neck supple  Lungs- Clear to ausculation bilaterally, normal work of breathing.  No wheezes, rales, rhonchi Heart- Regular rate and rhythm, no murmurs, rubs or gallops  GI- soft, non-tender, non-distended, bowel sounds present  Extremities- no clubbing or cyanosis. No edema MS- no significant deformity or atrophy Skin- warm and dry, no rash or lesion; PPM pocket well healed Psych- euthymic mood, full affect Neuro- strength and sensation are intact  PPM Interrogation- reviewed in detail today,  See PACEART report  EKG:  EKG is ordered today. Shows sinus bradycardia at 54 bpm  Recent Labs: 02/04/2021: BUN 12; Creatinine, Ser 1.17; Hemoglobin 16.0; Platelets 169; Potassium 4.4; Sodium 140   Wt Readings from Last 3 Encounters:  02/06/21 234 lb 3.2 oz (106.2 kg)  02/04/21 235 lb (106.6 kg)  12/29/20 230 lb (104.3 kg)     Other studies Reviewed: Additional studies/ records that were reviewed  today include: Previous EP office notes, Previous remote checks, Most recent labwork.   Assessment and Plan:  1. Sick sinus syndrome s/p Medtronic PPM  S/p device extraction for infected site Sinus brady on EKG today.  Per discussion plan to avoid re-implantation if possible with nocturnal bradycardia noted previously. Pt raises questions of why we proceeded with gen change if the thought now is he may not need re-implantation.  We discussed that generator change of an existing device is a relatively straightforward procedure, where as re-implantation post extraction and device infection is significantly more risk laden.   Sutures removed from site today per Dr. Lovena Le.  Current medicines are reviewed at length with the patient today.     Disposition:   Follow up with Dr. Caryl Comes as scheduled.   Jacalyn Lefevre, PA-C  02/16/2021 10:59 AM  [facility] [address] [place] [address] [phone] (office) [phone] (fax) ance

## 2021-02-17 ENCOUNTER — Other Ambulatory Visit: Payer: Self-pay

## 2021-02-17 ENCOUNTER — Ambulatory Visit (INDEPENDENT_AMBULATORY_CARE_PROVIDER_SITE_OTHER): Payer: BC Managed Care – PPO | Admitting: Student

## 2021-02-17 ENCOUNTER — Encounter: Payer: Self-pay | Admitting: Student

## 2021-02-17 VITALS — BP 118/60 | HR 54 | Ht 73.0 in | Wt 238.8 lb

## 2021-02-17 DIAGNOSIS — T827XXD Infection and inflammatory reaction due to other cardiac and vascular devices, implants and grafts, subsequent encounter: Secondary | ICD-10-CM

## 2021-02-17 DIAGNOSIS — T827XXA Infection and inflammatory reaction due to other cardiac and vascular devices, implants and grafts, initial encounter: Secondary | ICD-10-CM | POA: Diagnosis not present

## 2021-02-17 DIAGNOSIS — I495 Sick sinus syndrome: Secondary | ICD-10-CM | POA: Diagnosis not present

## 2021-02-17 NOTE — Patient Instructions (Signed)

## 2021-02-18 ENCOUNTER — Encounter: Payer: Self-pay | Admitting: Internal Medicine

## 2021-02-23 MED ORDER — DOXYCYCLINE MONOHYDRATE 100 MG PO CAPS: 100.0000 mg | ORAL_CAPSULE | Freq: Two times a day (BID) | ORAL | 0 refills | Status: DC

## 2021-02-23 NOTE — Telephone Encounter (Signed)
Message sent to Dr. Lovena Le regarding patient request for Antibiotic script.

## 2021-03-02 ENCOUNTER — Ambulatory Visit: Payer: Self-pay

## 2021-03-02 ENCOUNTER — Other Ambulatory Visit: Payer: Self-pay

## 2021-03-02 DIAGNOSIS — T827XXD Infection and inflammatory reaction due to other cardiac and vascular devices, implants and grafts, subsequent encounter: Secondary | ICD-10-CM

## 2021-03-02 MED ORDER — DOXYCYCLINE HYCLATE 50 MG PO CAPS: 100.0000 mg | ORAL_CAPSULE | Freq: Two times a day (BID) | ORAL | 0 refills | Status: AC

## 2021-03-02 MED ORDER — DOXYCYCLINE HYCLATE 100 MG PO TABS: 100.0000 mg | ORAL_TABLET | Freq: Two times a day (BID) | ORAL | Status: AC

## 2021-03-02 NOTE — Progress Notes (Signed)
Wound re-check incision by Dr. Lovena Le, incision packed with sterile 4x4 and covered with 4x4 and paper tape. Patient instructed to pack incision with sterile gauze using sterile q-tip once daily and return to office on 03/16/21 at 0900AM, patient provided with materials to pack incision site.

## 2021-03-11 ENCOUNTER — Other Ambulatory Visit: Payer: Self-pay

## 2021-03-11 ENCOUNTER — Encounter (HOSPITAL_BASED_OUTPATIENT_CLINIC_OR_DEPARTMENT_OTHER): Payer: BC Managed Care – PPO | Attending: Internal Medicine | Admitting: Internal Medicine

## 2021-03-11 ENCOUNTER — Ambulatory Visit: Payer: BC Managed Care – PPO | Admitting: Internal Medicine

## 2021-03-11 DIAGNOSIS — T8131XD Disruption of external operation (surgical) wound, not elsewhere classified, subsequent encounter: Secondary | ICD-10-CM | POA: Insufficient documentation

## 2021-03-11 DIAGNOSIS — B9561 Methicillin susceptible Staphylococcus aureus infection as the cause of diseases classified elsewhere: Secondary | ICD-10-CM | POA: Insufficient documentation

## 2021-03-11 DIAGNOSIS — X58XXXD Exposure to other specified factors, subsequent encounter: Secondary | ICD-10-CM | POA: Diagnosis not present

## 2021-03-11 DIAGNOSIS — Z950 Presence of cardiac pacemaker: Secondary | ICD-10-CM | POA: Diagnosis not present

## 2021-03-11 DIAGNOSIS — L98498 Non-pressure chronic ulcer of skin of other sites with other specified severity: Secondary | ICD-10-CM | POA: Diagnosis not present

## 2021-03-11 NOTE — Progress Notes (Signed)
Bob Schmidt, Bob D. ([id number]) Visit Report for 03/11/2021 Abuse/Suicide Risk Screen Details Patient Name: Date of Service: [name], [name] 03/11/2021 2:15 PM Medical Record Number: [id number] Patient Account Number: [id number] Date of Birth/Sex: Treating RN: 06-14-1955 (65 y.o. Bob Schmidt Primary Care Bob Schmidt: Bob Schmidt Other Clinician: Referring Bob Schmidt: Treating Bob Schmidt/Extender: Bob Schmidt in Treatment: 0 Abuse/Suicide Risk Screen Items Answer ABUSE RISK SCREEN: Has anyone close to you tried to hurt or harm you recentlyo No Do you feel uncomfortable with anyone in your familyo No Has anyone forced you do things that you didnt want to doo No Electronic Signature(s) Signed: 03/11/2021 5:23:39 PM By: Dellie Catholic RN Entered By: Dellie Catholic on 03/11/2021 14:55:07 -------------------------------------------------------------------------------- Activities of Daily Living Details Patient Name: Date of ServiceGenia Schmidt, [name] 03/11/2021 2:15 PM Medical Record Number: [id number] Patient Account Number: [id number] Date of Birth/Sex: Treating RN: November 27, 1955 (65 y.o. Bob Schmidt Primary Care Bob Schmidt: Bob Schmidt Other Clinician: Referring Bob Schmidt: Treating Bob Schmidt/Extender: Bob Schmidt in Treatment: 0 Activities of Daily Living Items Answer Activities of Daily Living (Please select one for each item) Drive Automobile Completely Able T Medications ake Completely Able Use T elephone Completely Able Care for Appearance Completely Able Use T oilet Completely Able Bath / Shower Completely Able Dress Self Completely Able Feed Self Completely Able Walk Completely Able Get In / Out Bed Completely Able Housework Completely Able Prepare Meals Completely Able Handle Money Completely Able Shop for Self Completely Able Electronic Signature(s) Signed: 03/11/2021 5:23:39 PM By: Dellie Catholic RN Entered By:  Dellie Catholic on 03/11/2021 14:55:45 -------------------------------------------------------------------------------- Education Screening Details Patient Name: Date of Service: Bob Schmidt, [name] 03/11/2021 2:15 PM Medical Record Number: [id number] Patient Account Number: [id number] Date of Birth/Sex: Treating RN: 23-Jan-1956 (65 y.o. Bob Schmidt Primary Care Bintou Lafata: Bob Schmidt Other Clinician: Referring Bob Schmidt: Treating Bob Schmidt/Extender: Bob Schmidt in Treatment: 0 Primary Learner Assessed: Patient Learning Preferences/Education Level/Primary Language Learning Preference: Explanation, Demonstration, Printed Material Highest Education Level: College or Above Preferred Language: English Cognitive Barrier Language Barrier: No Translator Needed: No Memory Deficit: No Emotional Barrier: No Cultural/Religious Beliefs Affecting Medical Care: No Physical Barrier Impaired Vision: No Impaired Hearing: No Decreased Hand dexterity: No Knowledge/Comprehension Knowledge Level: High Comprehension Level: High Ability to understand written instructions: High Ability to understand verbal instructions: High Motivation Anxiety Level: Calm Cooperation: Cooperative Education Importance: Acknowledges Need Interest in Health Problems: Asks Questions Perception: Coherent Willingness to Engage in Self-Management High Activities: Readiness to Engage in Self-Management High Activities: Electronic Signature(s) Signed: 03/11/2021 5:23:39 PM By: Dellie Catholic RN Entered By: Dellie Catholic on 03/11/2021 14:56:58 -------------------------------------------------------------------------------- Fall Risk Assessment Details Patient Name: Date of Service: Bob Schmidt, [name] 03/11/2021 2:15 PM Medical Record Number: [id number] Patient Account Number: [id number] Date of Birth/Sex: Treating RN: 05-02-1955 (65 y.o. Bob Schmidt Primary Care Okley Magnussen: Bob Schmidt Other Clinician: Referring Bob Schmidt: Treating Bob Schmidt/Extender: Bob Schmidt in Treatment: 0 Fall Risk Assessment Items Have you had 2 or more falls in the last 12 monthso 0 No Have you had any fall that resulted in injury in the last 12 monthso 0 No FALLS RISK SCREEN History of falling - immediate or within 3 months 0 No Secondary diagnosis (Do you have 2 or more medical diagnoseso) 0 No Ambulatory aid None/bed rest/wheelchair/nurse 0 Yes Crutches/cane/walker 0 No Furniture 0 No Intravenous therapy Access/Saline/Heparin Lock 0 No Gait/Transferring Normal/ bed rest/ wheelchair 0 Yes Weak (short  steps with or without shuffle, stooped but able to lift head while walking, may seek 0 No support from furniture) Impaired (short steps with shuffle, may have difficulty arising from chair, head down, impaired 0 No balance) Mental Status Oriented to own ability 0 No Electronic Signature(s) Signed: 03/11/2021 5:23:39 PM By: Dellie Catholic RN Entered By: Dellie Catholic on 03/11/2021 14:57:35 -------------------------------------------------------------------------------- Foot Assessment Details Patient Name: Date of Service: Bob Schmidt, [name] 03/11/2021 2:15 PM Medical Record Number: [id number] Patient Account Number: [id number] Date of Birth/Sex: Treating RN: [birth year] (65 y.o. Bob Schmidt Primary Care [place] Near: Bob Schmidt Other Clinician: Referring Bob Schmidt: Treating Bob Schmidt/Extender: Bob Schmidt in Treatment: 0 Foot Assessment Items Site Locations + = Sensation present, - = Sensation absent, C = Callus, U = Ulcer R = Redness, W = Warmth, M = Maceration, PU = Pre-ulcerative lesion F = Fissure, S = Swelling, D = Dryness Assessment Right: Left: Other Deformity: No No Prior Foot Ulcer: No No Prior Amputation: No No Charcot Joint: No No Ambulatory Status: Ambulatory Without Help Gait: Steady Electronic  Signature(s) Signed: 03/11/2021 5:23:39 PM By: Dellie Catholic RN Entered By: Dellie Catholic on 03/11/2021 14:58:40 -------------------------------------------------------------------------------- Nutrition Risk Screening Details Patient Name: Date of Service: Bob Schmidt, [name] 03/11/2021 2:15 PM Medical Record Number: [id number] Patient Account Number: [id number] Date of Birth/Sex: Treating RN: [birth year] (65 y.o. Bob Schmidt Primary Care Shamila Lerch: Bob Schmidt Other Clinician: Referring Waldo Damian: Treating Barre Aydelott/Extender: Bob Schmidt in Treatment: 0 Height (in): 73 Weight (lbs): 230 Body Mass Index (BMI): 30.3 Nutrition Risk Screening Items Score Screening NUTRITION RISK SCREEN: I have an illness or condition that made me change the kind and/or amount of food I eat 0 No I eat fewer than two meals per day 0 No I eat few fruits and vegetables, or milk products 0 No I have three or more drinks of beer, liquor or wine almost every day 0 No I have tooth or mouth problems that make it hard for me to eat 0 No I don't always have enough money to buy the food I need 0 No I eat alone most of the time 0 No I take three or more different prescribed or over-the-counter drugs a day 0 No Without wanting to, I have lost or gained 10 pounds in the last six months 0 No I am not always physically able to shop, cook and/or feed myself 0 No Nutrition Protocols Good Risk Protocol 0 No interventions needed Moderate Risk Protocol High Risk Proctocol Risk Level: Good Risk Score: 0 Electronic Signature(s) Signed: 03/11/2021 5:23:39 PM By: Dellie Catholic RN Entered By: Dellie Catholic on 03/11/2021 14:58:17

## 2021-03-13 ENCOUNTER — Encounter (HOSPITAL_BASED_OUTPATIENT_CLINIC_OR_DEPARTMENT_OTHER): Payer: BC Managed Care – PPO | Admitting: Internal Medicine

## 2021-03-13 NOTE — Progress Notes (Addendum)
Bob Schmidt, Bob D. ([id number]) Visit Report for 03/11/2021 Chief Complaint Document Details Patient Name: Date of Service: [name], [place] RD D. 03/11/2021 2:15 PM Medical Record Number: [id number] Patient Account Number: [id number] Date of Birth/Sex: Treating RN: 12-03-1955 (65 y.o. Bob Schmidt Primary Care Provider: Ramonita Lab Other Clinician: Referring Provider: Treating Provider/Extender: Romie Minus in Treatment: 0 Information Obtained from: Patient Chief Complaint 03/11/2021; patient comes in for review of a wound on his left upper chest secondary to recent removal of a pacemaker with subsequent infection Electronic Signature(s) Signed: 03/11/2021 4:51:07 PM By: Linton Ham MD Entered By: Linton Ham on 03/11/2021 16:41:39 -------------------------------------------------------------------------------- HPI Details Patient Name: Date of Service: Bob Schmidt, EDWA RD D. 03/11/2021 2:15 PM Medical Record Number: [id number] Patient Account Number: [id number] Date of Birth/Sex: Treating RN: Nov 20, 1955 (65 y.o. 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 According to the cardiology records he developed a staph abscess in 2017 although I am not sure exactly where this is. He required IV antibiotics. He recently had a generator change on 12/29/2020. He developed an open wound with drainage. Dr. Darene Lamer aylor under took a removal of the pacemaker system on 11/9 apparently it is no longer indicated for the type of bradycardia he had. The leads were retrieved under general anesthesia. Culture showed methicillin sensitive staph aureus and he  is now on doxycycline. The wound was closed with sutures however unfortunately this dehisced. He has been packing this with wet-to-dry dressing still taking doxycycline as near as I can see for the past month Past medical history includes pacemaker removal on 02/06/2021 was discussed CandS showed rare staph methicillin sensitive staph aureus he has been given doxycycline. Apparently after extraction he developed a seroma or hematoma that became secondarily infected. 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 03/11/2021 2:15 PM Medical Record Number: [id number] Patient Account Number: [id number] Date of Birth/Sex: Treating RN: Feb 21, 1956 (65 y.o. Bob Schmidt Primary Care Provider: Ramonita Lab Other Clinician: Referring Provider: Treating Provider/Extender: Romie Minus in Treatment: 0 Constitutional Sitting or standing Blood Pressure is within target range for patient.. Pulse regular and within target range for patient.Marland Kitchen Respirations regular, non-labored and within target range.. Temperature is normal and within the target range for the patient.Marland Kitchen Appears in no distress. Notes Wound exam; the areas in the left anterior upper chest clearly the pacemaker site. This has measurements of 3 x 1.4 x 2.5 cm of direct depth. Unfortunately there is 6.5 cm of undermining at roughly 6:00. What I can see of the tissue underneath all of this looks healthy granulation. There is no infection there is no surrounding tenderness no crepitus Electronic Signature(s) Signed: 03/11/2021 4:51:07 PM By: Linton Ham MD Entered By: Linton Ham on 03/11/2021  16:46:30 -------------------------------------------------------------------------------- Physician Orders Details Patient Name: Date of Service: Bob Schmidt, EDWA RD D. 03/11/2021 2:15 PM Medical Record Number: [id number] Patient Account Number: [id number] Date of Birth/Sex: Treating RN: 14-May-1955 (65 y.o. Burnadette Pop, Lauren Primary Care Provider: Ramonita Lab Other Clinician:  Referring Provider: Treating Provider/Extender: Romie Minus in Treatment: 0 Verbal / Phone Orders: No Diagnosis Coding Follow-up Appointments Return appointment in 3 weeks. - Dr. Dellia Nims Bathing/ Shower/ Hygiene May shower with protection but do not get wound dressing(s) wet. Wound Treatment Wound #1 - Chest Wound Laterality: Left Cleanser: Wound Cleanser (DME) (Generic) 1 x Per Day/30 Days Discharge Instructions: Cleanse the wound with wound cleanser prior to applying a clean dressing using gauze sponges, not tissue or cotton balls. Peri-Wound Care: Skin Prep (DME) (Generic) 1 x Per Day/30 Days Discharge Instructions: Use skin prep as directed Prim Dressing: Hydrofera Blue Ready Foam, 4x5 in (DME) (Generic) 1 x Per Day/30 Days ary Discharge Instructions: Apply to wound bed as instructed Secondary Dressing: Bordered Gauze, 4x4 in (DME) (Generic) 1 x Per Day/30 Days Discharge Instructions: Apply over primary dressing as directed. Electronic Signature(s) Signed: 03/17/2021 4:29:35 PM By: Linton Ham MD Signed: 04/16/2021 12:37:40 PM By: Rhae Hammock RN Previous Signature: 03/11/2021 4:51:07 PM Version By: Linton Ham MD Previous Signature: 03/13/2021 11:40:20 AM Version By: Rhae Hammock RN Entered By: Rhae Hammock on 03/17/2021 07:50:09 -------------------------------------------------------------------------------- Problem List Details Patient Name: Date of Service: Bob Schmidt, EDWA RD D. 03/11/2021 2:15 PM Medical Record Number: [id number] Patient Account Number:  [id number] Date of Birth/Sex: Treating RN: 12/28/1955 (65 y.o. Bob Schmidt Primary Care Provider: Ramonita Lab Other Clinician: Referring Provider: Treating Provider/Extender: Romie Minus in Treatment: 0 Active Problems ICD-10 Encounter Code Description Active Date MDM Diagnosis L98.498 Non-pressure chronic ulcer of skin of other sites with other specified severity 03/11/2021 No Yes T81.31XD Disruption of external operation (surgical) wound, not elsewhere classified, 03/11/2021 No Yes subsequent encounter B95.61 Methicillin susceptible Staphylococcus aureus infection as the cause of 03/11/2021 No Yes diseases classified elsewhere Inactive Problems Resolved Problems Electronic Signature(s) Signed: 03/11/2021 4:51:07 PM By: Linton Ham MD Entered By: Linton Ham on 03/11/2021 16:40:15 -------------------------------------------------------------------------------- Progress Note Details Patient Name: Date of Service: Bob Schmidt, EDWA RD D. 03/11/2021 2:15 PM Medical Record Number: [id number] Patient Account Number: [id number] Date of Birth/Sex: Treating RN: [birth year] (65 y.o. 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 According to the cardiology records he developed a staph abscess in 2017 although I am not sure exactly where this is. He required IV antibiotics. He recently had a generator change on 12/29/2020. He developed  an open wound with drainage. Dr. Darene Lamer aylor under took a removal of the pacemaker system on 11/9 apparently it is no longer indicated for the type of bradycardia he had. The leads were retrieved under general anesthesia. Culture showed methicillin sensitive staph aureus and he is now on doxycycline. The wound was closed with sutures however unfortunately this dehisced. He has been packing this with wet-to-dry dressing still taking doxycycline as near as I can see for the past month Past medical history includes pacemaker removal on 02/06/2021 was discussed CandS showed rare staph methicillin sensitive staph aureus he has been given doxycycline. Apparently after extraction he developed a seroma or hematoma that became secondarily infected. 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 Allergies No Known Allergies Family History Unknown History. Social History Never smoker, Marital Status - Married, Alcohol Use - Rarely, Drug Use - No History, Caffeine Use - Daily. Medical History Integumentary (Skin) Denies history of History of Burn Musculoskeletal Denies history of Gout, Rheumatoid Arthritis, Osteoarthritis, Osteomyelitis Neurologic Denies history of Dementia, Neuropathy, Quadriplegia, Paraplegia, Seizure Disorder Hospitalization/Surgery History - Neck Surgery(as per pt.). Medical A Surgical History Notes nd Cardiovascular 2010 Nocturnal Bradycardia .Currently pacemaker was removed Oncologic Skin cancer (basal cell as per pt.) Review of Systems (ROS) Constitutional Symptoms ([place]) Denies complaints or symptoms of Fatigue, Fever, Chills, Marked Weight Change. Eyes Denies complaints or symptoms of Dry Eyes, Vision Changes, Glasses / Contacts. Ear/Nose/Mouth/Throat Denies complaints or symptoms of Chronic sinus problems or rhinitis. Respiratory Denies complaints or symptoms of Chronic or  frequent coughs, Shortness of Breath. Cardiovascular Complains or has symptoms of Chest pain. Gastrointestinal Denies complaints or symptoms of Frequent diarrhea, Nausea, Vomiting. Endocrine Denies complaints or symptoms of Heat/cold intolerance. Genitourinary Denies complaints or symptoms of Frequent urination. Integumentary (Skin) Complains or has symptoms of Wounds - Upper Left chest. (Pacemaker was removed). Musculoskeletal Denies complaints or symptoms of Muscle Pain, Muscle Weakness. Neurologic Denies complaints or symptoms of Numbness/parasthesias. Psychiatric Denies complaints or symptoms of Claustrophobia, Suicidal. Objective Constitutional Sitting or standing Blood Pressure is within target range for patient.. Pulse regular and within target range for patient.Marland Kitchen Respirations regular, non-labored and within target range.. Temperature is normal and within the target range for the patient.Marland Kitchen Appears in no distress. Vitals Time Taken: 2:45 PM, Height: 73 in, Source: Stated, Weight: 230 lbs, Source: Stated, BMI: 30.3, Temperature: 97.7 F, Pulse: 65 bpm, Respiratory Rate: 16 breaths/min, Blood Pressure: 117/68 mmHg. General Notes: Wound exam; the areas in the left anterior upper chest clearly the pacemaker site. This has measurements of 3 x 1.4 x 2.5 cm of direct depth. Unfortunately there is 6.5 cm of undermining at roughly 6:00. What I can see of the tissue underneath all of this looks healthy granulation. There is no infection there is no surrounding tenderness no crepitus Integumentary (Hair, Skin) Wound #1 status is Open. Original cause of wound was Surgical Injury. The date acquired was: 02/05/2021. The wound is located on the Left Chest. The wound measures 3cm length x 1.4cm width x 2.5cm depth; 3.299cm^2 area and 8.247cm^3 volume. There is Fat Layer (Subcutaneous Tissue) exposed. There is no tunneling noted, however, there is undermining starting at 3:00 and ending at 12:00  with a maximum distance of 6.5cm. There is a large amount of serous drainage noted. The wound margin is distinct with the outline attached to the wound base. There is large (67-100%) red granulation within the wound bed. There is no necrotic tissue within the wound bed. Assessment Active Problems ICD-10 Non-pressure chronic ulcer of skin of other sites with other specified severity Disruption of external operation (surgical) wound, not elsewhere classified, subsequent encounter Methicillin susceptible Staphylococcus aureus infection as the cause of diseases classified elsewhere Plan Follow-up Appointments: Return appointment in 3 weeks. - Dr. Dellia Nims Bathing/ Shower/ Hygiene: May shower with protection but do not get wound dressing(s) wet. WOUND #1: - Chest Wound Laterality: Left Cleanser: Wound Cleanser (DME) (Generic) 1 x Per Day/30 Days Discharge Instructions: Cleanse the wound with wound cleanser prior to applying a clean dressing using gauze sponges, not tissue or cotton balls. Peri-Wound Care: Skin Prep (DME) (Generic) 1 x Per Day/30 Days  Discharge Instructions: Use skin prep as directed Prim Dressing: Hydrofera Blue Ready Foam, 4x5 in (DME) (Generic) 1 x Per Day/30 Days ary Discharge Instructions: Apply to wound bed as instructed Secondary Dressing: Bordered Gauze, 4x4 in (DME) (Generic) 1 x Per Day/30 Days Discharge Instructions: Apply over primary dressing as directed. 1. Open wound status post removal of an infected pacemaker site with MSSA 2. I see no evidence of current infection I think when his doxycycline finishes this can discontinue 3. Per the patient's observation is wet-to-dry dressing currently is not really resulting in any improvement in the granulation 4. We discussed the option of continuing the wet-to-dry dressings or substituting a packing strip versus using a wound VAC. He elected to try Hydrofera Blue rope which I think is a good option. 5. We will carefully  watch the direct depth of 2.5 cm the maximal tunneling depth at 6:00 at 6.5 cm. I think the quickest way to get this to close might be a wound VAC although I think there is too much volume for the snap VAC he have to use a battery VAC which would make it difficult for him to work 6. I think the other option would be to open this further by general surgery presumably and then making an attempt to slow this together. There would be a fairly good chance of this not being effective however Electronic Signature(s) Signed: 03/11/2021 4:51:07 PM By: Linton Ham MD Entered By: Linton Ham on 03/11/2021 16:48:40 -------------------------------------------------------------------------------- HxROS Details Patient Name: Date of Service: Bob Schmidt, EDWA RD D. 03/11/2021 2:15 PM Medical Record Number: [id number] Patient Account Number: [id number] Date of Birth/Sex: Treating RN: 1955-11-30 (65 y.o. Collene Gobble Primary Care Provider: Ramonita Lab Other Clinician: Referring Provider: Treating Provider/Extender: Romie Minus in Treatment: 0 Information Obtained From Patient Constitutional Symptoms (General Health) Complaints and Symptoms: Negative for: Fatigue; Fever; Chills; Marked Weight Change Eyes Complaints and Symptoms: Negative for: Dry Eyes; Vision Changes; Glasses / Contacts Ear/Nose/Mouth/Throat Complaints and Symptoms: Negative for: Chronic sinus problems or rhinitis Respiratory Complaints and Symptoms: Negative for: Chronic or frequent coughs; Shortness of Breath Cardiovascular Complaints and Symptoms: Positive for: Chest pain Medical History: Past Medical History Notes: 2010 Nocturnal Bradycardia .Currently pacemaker was removed Gastrointestinal Complaints and Symptoms: Negative for: Frequent diarrhea; Nausea; Vomiting Endocrine Complaints and Symptoms: Negative for: Heat/cold intolerance Genitourinary Complaints and Symptoms: Negative for:  Frequent urination Integumentary (Skin) Complaints and Symptoms: Positive for: Wounds - Upper Left chest. (Pacemaker was removed) Medical History: Negative for: History of Burn Musculoskeletal Complaints and Symptoms: Negative for: Muscle Pain; Muscle Weakness Medical History: Negative for: Gout; Rheumatoid Arthritis; Osteoarthritis; Osteomyelitis Neurologic Complaints and Symptoms: Negative for: Numbness/parasthesias Medical History: Negative for: Dementia; Neuropathy; Quadriplegia; Paraplegia; Seizure Disorder Psychiatric Complaints and Symptoms: Negative for: Claustrophobia; Suicidal Hematologic/Lymphatic Immunological Oncologic Medical History: Past Medical History Notes: Skin cancer (basal cell as per pt.) Immunizations Pneumococcal Vaccine: Received Pneumococcal Vaccination: No Tetanus Vaccine: Last tetanus shot: 03/09/2021 Implantable Devices None Hospitalization / Surgery History Type of Hospitalization/Surgery Neck Surgery(as per pt.) Family and Social History Unknown History: Yes; Never smoker; Marital Status - Married; Alcohol Use: Rarely; Drug Use: No History; Caffeine Use: Daily; Financial Concerns: No; Food, Clothing or Shelter Needs: No; Support System Lacking: No; Transportation Concerns: No Electronic Signature(s) Signed: 03/11/2021 4:51:07 PM By: Linton Ham MD Signed: 03/11/2021 5:23:39 PM By: Dellie Catholic RN Entered By: Dellie Catholic on 03/11/2021 14:54:53 -------------------------------------------------------------------------------- SuperBill Details Patient Name: Date of Service: Bob Schmidt, EDWA RD D. 03/11/2021 Medical  Record Number: [id number] Patient Account Number: [id number] Date of Birth/Sex: Treating RN: [birth year] (65 y.o. Burnadette Pop, Lauren Primary Care Provider: Ramonita Lab Other Clinician: Referring Provider: Treating Provider/Extender: Romie Minus in Treatment: 0 Diagnosis Coding ICD-10  Codes Code Description [phone] Non-pressure chronic ulcer of skin of other sites with other specified severity T81.31XD Disruption of external operation (surgical) wound, not elsewhere classified, subsequent encounter B95.61 Methicillin susceptible Staphylococcus aureus infection as the cause of diseases classified elsewhere Facility Procedures CPT4 Code: [id number] Description: 99213 - WOUND CARE VISIT-LEV 3 EST PT Modifier: Quantity: 1 Physician Procedures : CPT4 Code Description Modifier [id number] WC PHYS LEVEL 3 NEW PT ICD-10 Diagnosis Description L98.498 Non-pressure chronic ulcer of skin of other sites with other specified severity T81.31XD Disruption of external operation (surgical) wound, not elsewhere  classified, subsequent encounter B95.61 Methicillin susceptible Staphylococcus aureus infection as the cause of diseases classified elsewh Quantity: 1 ere Electronic Signature(s) Signed: 03/11/2021 4:51:07 PM By: Linton Ham MD Entered By: Linton Ham on 03/11/2021 16:49:10

## 2021-03-13 NOTE — Progress Notes (Signed)
Sadowsky, Conan D. ([id number]) Visit Report for 03/11/2021 Allergy List Details Patient Name: Date of Service: [name], [place] RD D. 03/11/2021 2:15 PM Medical Record Number: [id number] Patient Account Number: [id number] Date of Birth/Sex: Treating RN: [birth year] (65 y.o. Collene Gobble Primary Care Wanya Bangura: [facility]on[facility] Other Clinician: Referring Denys Labree: Treating Korinne Greenstein/Extender: Romie Minus in Treatment: 0 Allergies Active Allergies No Known Allergies Allergy Notes Electronic Signature(s) Signed: 03/11/2021 5:23:39 PM By: Dellie Catholic RN Entered By: Dellie Catholic on 03/11/2021 14:47:44 -------------------------------------------------------------------------------- Arrival Information Details Patient Name: Date of Service: [name], EDWA RD D. 03/11/2021 2:15 PM Medical Record Number: [id number] Patient Account Number: [id number] Date of Birth/Sex: Treating RN: 1956/01/29 (65 y.o. Collene Gobble Primary Care Denajah Farias: [facility] Other Clinician: Referring Elison Worrel: Treating Rhilyn Battle/Extender: Romie Minus in Treatment: 0 Visit Information Patient Arrived: Ambulatory Arrival Time: 14:37 Accompanied By: self Transfer Assistance: None Patient Identification Verified: Yes Secondary Verification Process Completed: Yes Patient Requires Transmission-Based Precautions: No Patient Has Alerts: No Electronic Signature(s) Signed: 03/11/2021 5:23:39 PM By: Dellie Catholic RN Entered By: Dellie Catholic on 03/11/2021 14:46:41 -------------------------------------------------------------------------------- Clinic Level of Care Assessment Details Patient Name: Date of Service: [name], [place] RD D. 03/11/2021 2:15 PM Medical Record Number: [id number] Patient Account Number: [id number] Date of Birth/Sex: Treating RN: [birth year] (65 y.o. Burnadette Pop, Lauren Primary Care Jp Eastham: Ramonita Lab Other Clinician: Referring  Angla Delahunt: Treating Alantra Popoca/Extender: Romie Minus in Treatment: 0 Clinic Level of Care Assessment Items TOOL 4 Quantity Score X- 1 0 Use when only an EandM is performed on FOLLOW-UP visit ASSESSMENTS - Nursing Assessment / Reassessment X- 1 10 Reassessment of Co-morbidities (includes updates in patient status) X- 1 5 Reassessment of Adherence to Treatment Plan ASSESSMENTS - Wound and Skin A ssessment / Reassessment X - Simple Wound Assessment / Reassessment - one wound 1 5 []  - 0 Complex Wound Assessment / Reassessment - multiple wounds []  - 0 Dermatologic / Skin Assessment (not related to wound area) ASSESSMENTS - Focused Assessment []  - 0 Circumferential Edema Measurements - multi extremities []  - 0 Nutritional Assessment / Counseling / Intervention []  - 0 Lower Extremity Assessment (monofilament, tuning fork, pulses) []  - 0 Peripheral Arterial Disease Assessment (using hand held doppler) ASSESSMENTS - Ostomy and/or Continence Assessment and Care []  - 0 Incontinence Assessment and Management []  - 0 Ostomy Care Assessment and Management (repouching, etc.) PROCESS - Coordination of Care X - Simple Patient / Family Education for ongoing care 1 15 []  - 0 Complex (extensive) Patient / Family Education for ongoing care X- 1 10 Staff obtains Programmer, systems, Records, T Results / Process Orders est []  - 0 Staff telephones HHA, Nursing Homes / Clarify orders / etc []  - 0 Routine Transfer to another Facility (non-emergent condition) []  - 0 Routine Hospital Admission (non-emergent condition) X- 1 15 New Admissions / Biomedical engineer / Ordering NPWT Apligraf, etc. , []  - 0 Emergency Hospital Admission (emergent condition) X- 1 10 Simple Discharge Coordination []  - 0 Complex (extensive) Discharge Coordination PROCESS - Special Needs []  - 0 Pediatric / Minor Patient Management []  - 0 Isolation Patient Management []  - 0 Hearing / Language /  Visual special needs []  - 0 Assessment of Community assistance (transportation, D/C planning, etc.) []  - 0 Additional assistance / Altered mentation []  - 0 Support Surface(s) Assessment (bed, cushion, seat, etc.) INTERVENTIONS - Wound Cleansing / Measurement X - Simple Wound Cleansing - one wound 1 5 []  - 0 Complex Wound Cleansing -  multiple wounds X- 1 5 Wound Imaging (photographs - any number of wounds) []  - 0 Wound Tracing (instead of photographs) X- 1 5 Simple Wound Measurement - one wound []  - 0 Complex Wound Measurement - multiple wounds INTERVENTIONS - Wound Dressings X - Small Wound Dressing one or multiple wounds 1 10 []  - 0 Medium Wound Dressing one or multiple wounds []  - 0 Large Wound Dressing one or multiple wounds X- 1 5 Application of Medications - topical []  - 0 Application of Medications - injection INTERVENTIONS - Miscellaneous []  - 0 External ear exam []  - 0 Specimen Collection (cultures, biopsies, blood, body fluids, etc.) []  - 0 Specimen(s) / Culture(s) sent or taken to Lab for analysis []  - 0 Patient Transfer (multiple staff / Civil Service fast streamer / Similar devices) []  - 0 Simple Staple / Suture removal (25 or less) []  - 0 Complex Staple / Suture removal (26 or more) []  - 0 Hypo / Hyperglycemic Management (close monitor of Blood Glucose) []  - 0 Ankle / Brachial Index (ABI) - do not check if billed separately X- 1 5 Vital Signs Has the patient been seen at the hospital within the last three years: Yes Total Score: 105 Level Of Care: New/Established - Level 3 Electronic Signature(s) Signed: 03/13/2021 11:40:20 AM By: Rhae Hammock RN Entered By: Rhae Hammock on 03/11/2021 16:27:54 -------------------------------------------------------------------------------- Encounter Discharge Information Details Patient Name: Date of Service: [name], EDWA RD D. 03/11/2021 2:15 PM Medical Record Number: [id number] Patient Account Number: [id number] Date  of Birth/Sex: Treating RN: [birth year] (65 y.o. Burnadette Pop, Lauren Primary Care Dashay Giesler: [facility]onita Lab Other Clinician: Referring Chala Gul: Treating Rushi Chasen/Extender: Romie Minus in Treatment: 0 Encounter Discharge Information Items Discharge Condition: Stable Ambulatory Status: Ambulatory Discharge Destination: Home Transportation: Private Auto Accompanied By: SELf Schedule Follow-up Appointment: Yes Clinical Summary of Care: Patient Declined Electronic Signature(s) Signed: 03/13/2021 11:40:20 AM By: Rhae Hammock RN Entered By: Rhae Hammock on 03/11/2021 16:28:45 -------------------------------------------------------------------------------- Lower Extremity Assessment Details Patient Name: Date of Service: [name], EDWA RD D. 03/11/2021 2:15 PM Medical Record Number: [id number] Patient Account Number: [id number] Date of Birth/Sex: Treating RN: May 14, 1955 (65 y.o. Collene Gobble Primary Care Maxcine Strong: Ramonita Lab Other Clinician: Referring Thelia Tanksley: Treating Uchenna Seufert/Extender: Romie Minus in Treatment: 0 Electronic Signature(s) Signed: 03/11/2021 5:23:39 PM By: Dellie Catholic RN Entered By: Dellie Catholic on 03/11/2021 14:58:49 -------------------------------------------------------------------------------- Multi Wound Chart Details Patient Name: Date of Service: [name], EDWA RD D. 03/11/2021 2:15 PM Medical Record Number: [id number] Patient Account Number: [id number] Date of Birth/Sex: Treating RN: 1955-11-11 (65 y.o. Janyth Contes Primary Care Shaka Cardin: Ramonita Lab Other Clinician: Referring Arihant Pennings: Treating Londyn Hotard/Extender: Romie Minus in Treatment: 0 Vital Signs Height(in): 73 Pulse(bpm): 43 Weight(lbs): 41 Blood Pressure(mmHg): 117/68 Body Mass Index(BMI): 30 Temperature(F): 97.7 Respiratory Rate(breaths/min): 16 Photos: [1:No Photos Left Chest] [N/A:N/A N/A] Wound  Location: [1:Surgical Injury] [N/A:N/A] Wounding Event: [1:T be determined o] [N/A:N/A] Primary Etiology: [1:02/05/2021] [N/A:N/A] Date Acquired: [1:0] [N/A:N/A] Weeks of Treatment: [1:Open] [N/A:N/A] Wound Status: [1:3x1.4x2.5] [N/A:N/A] Measurements L x W x D (cm) [1:3.299] [N/A:N/A] A (cm) : rea [1:8.247] [N/A:N/A] Volume (cm) : [1:0.00%] [N/A:N/A] % Reduction in A rea: [1:-2399.10%] [N/A:N/A] % Reduction in Volume: [1:3] Starting Position 1 (o'clock): [1:12] Ending Position 1 (o'clock): [1:6.5] Maximum Distance 1 (cm): [1:Yes] [N/A:N/A] Undermining: [1:Full Thickness Without Exposed] [N/A:N/A] Classification: [1:Support Structures Large] [N/A:N/A] Exudate Amount: [1:Serous] [N/A:N/A] Exudate Type: [1:amber] [N/A:N/A] Exudate Color: [1:Distinct, outline attached] [N/A:N/A] Wound Margin: [1:Large (67-100%)] [N/A:N/A] Granulation Amount: [  1:Red] [N/A:N/A] Granulation Quality: [1:None Present (0%)] [N/A:N/A] Necrotic Amount: [1:Fat Layer (Subcutaneous Tissue): Yes N/A] Exposed Structures: [1:Fascia: No Tendon: No Muscle: No Joint: No Bone: No None] [N/A:N/A] Treatment Notes Wound #1 (Chest) Wound Laterality: Left Cleanser Wound Cleanser Discharge Instruction: Cleanse the wound with wound cleanser prior to applying a clean dressing using gauze sponges, not tissue or cotton balls. Peri-Wound Care Skin Prep Discharge Instruction: Use skin prep as directed Topical Primary Dressing Hydrofera Blue Ready Foam, 4x5 in Discharge Instruction: Apply to wound bed as instructed Secondary Dressing Bordered Gauze, 4x4 in Discharge Instruction: Apply over primary dressing as directed. Secured With Compression Wrap Compression Stockings Environmental education officer) Signed: 03/11/2021 4:51:07 PM By: Linton Ham MD Signed: 03/11/2021 5:40:46 PM By: Levan Hurst RN, BSN Entered By: Linton Ham on 03/11/2021  16:45:29 -------------------------------------------------------------------------------- Multi-Disciplinary Care Plan Details Patient Name: Date of Service: [name], EDWA RD D. 03/11/2021 2:15 PM Medical Record Number: [id number] Patient Account Number: [id number] Date of Birth/Sex: Treating RN: [birth year] (65 y.o. Burnadette Pop, Lauren Primary Care Dexter Sauser: Ramonita Lab Other Clinician: Referring Laira Penninger: Treating Ziyan Schoon/Extender: Romie Minus in Treatment: 0 Active Inactive Orientation to the Wound Care Program Nursing Diagnoses: Knowledge deficit related to the wound healing center program Goals: Patient/caregiver will verbalize understanding of the [place] Date Initiated: 03/11/2021 Target Resolution Date: 03/11/2021 Goal Status: Active Interventions: Provide education on orientation to the wound center Notes: Wound/Skin Impairment Nursing Diagnoses: Impaired tissue integrity Knowledge deficit related to ulceration/compromised skin integrity Goals: Patient will have a decrease in wound volume by X% from date: (specify in notes) Date Initiated: 03/11/2021 Target Resolution Date: 04/04/2021 Goal Status: Active Patient/caregiver will verbalize understanding of skin care regimen Date Initiated: 03/11/2021 Target Resolution Date: 04/04/2021 Goal Status: Active Ulcer/skin breakdown will have a volume reduction of 30% by week 4 Date Initiated: 03/11/2021 Target Resolution Date: 04/04/2021 Goal Status: Active Interventions: Assess patient/caregiver ability to obtain necessary supplies Assess patient/caregiver ability to perform ulcer/skin care regimen upon admission and as needed Assess ulceration(s) every visit Notes: Electronic Signature(s) Signed: 03/13/2021 11:40:20 AM By: Rhae Hammock RN Entered By: Rhae Hammock on 03/11/2021  16:26:30 -------------------------------------------------------------------------------- Pain Assessment Details Patient Name: Date of Service: [name], EDWA RD D. 03/11/2021 2:15 PM Medical Record Number: [id number] Patient Account Number: [id number] Date of Birth/Sex: Treating RN: [birth year] (65 y.o. Collene Gobble Primary Care Tyland Klemens: Ramonita Lab Other Clinician: Referring Beila Purdie: Treating Deandria Klute/Extender: Romie Minus in Treatment: 0 Active Problems Location of Pain Severity and Description of Pain Patient Has Paino No Site Locations Pain Management and Medication Current Pain Management: Electronic Signature(s) Signed: 03/11/2021 5:23:39 PM By: Dellie Catholic RN Entered By: Dellie Catholic on 03/11/2021 15:33:17 -------------------------------------------------------------------------------- Patient/Caregiver Education Details Patient Name: Date of Service: [name], Luberta [place] RD D. 12/14/2022andnbsp2:15 PM Medical Record Number: [id number] Patient Account Number: [id number] Date of Birth/Gender: Treating RN: 12/27/1955 (65 y.o. Erie Noe Primary Care Physician: Ramonita Lab Other Clinician: Referring Physician: Treating Physician/Extender: Romie Minus in Treatment: 0 Education Assessment Education Provided To: Patient Education Topics Provided Welcome T [facility] [place]: o Methods: Explain/Verbal Responses: Reinforcements needed, State content correctly Wound/Skin Impairment: Methods: Explain/Verbal Responses: Reinforcements needed, State content correctly Electronic Signature(s) Signed: 03/13/2021 11:40:20 AM By: Rhae Hammock RN Entered By: Rhae Hammock on 03/11/2021 16:26:55 -------------------------------------------------------------------------------- Wound Assessment Details Patient Name: Date of Service: [name], EDWA RD D. 03/11/2021 2:15 PM Medical Record Number:  [id number] Patient Account Number: [id number] Date of Birth/Sex: Treating RN:  1955/10/24 (65 y.o. Collene Gobble Primary Care Taite Schoeppner: Ramonita Lab Other Clinician: Referring Amadi Yoshino: Treating Jalena Vanderlinden/Extender: Romie Minus in Treatment: 0 Wound Status Wound Number: 1 Primary Etiology: T be determined o Wound Location: Left Chest Wound Status: Open Wounding Event: Surgical Injury Date Acquired: 02/05/2021 Weeks Of Treatment: 0 Clustered Wound: No Photos Photo Uploaded By: Donavan Burnet on 03/12/2021 09:34:58 Wound Measurements Length: (cm) 3 Width: (cm) 1.4 Depth: (cm) 2.5 Area: (cm) 3.299 Volume: (cm) 8.247 % Reduction in Area: 0% % Reduction in Volume: -2399.1% Epithelialization: None Tunneling: No Undermining: Yes Starting Position (o'clock): 3 Ending Position (o'clock): 12 Maximum Distance: (cm) 6.5 Wound Description Classification: Full Thickness Without Exposed Support Structures Wound Margin: Distinct, outline attached Exudate Amount: Large Exudate Type: Serous Exudate Color: amber Foul Odor After Cleansing: No Slough/Fibrino No Wound Bed Granulation Amount: Large (67-100%) Exposed Structure Granulation Quality: Red Fascia Exposed: No Necrotic Amount: None Present (0%) Fat Layer (Subcutaneous Tissue) Exposed: Yes Tendon Exposed: No Muscle Exposed: No Joint Exposed: No Bone Exposed: No Treatment Notes Wound #1 (Chest) Wound Laterality: Left Cleanser Wound Cleanser Discharge Instruction: Cleanse the wound with wound cleanser prior to applying a clean dressing using gauze sponges, not tissue or cotton balls. Peri-Wound Care Skin Prep Discharge Instruction: Use skin prep as directed Topical Primary Dressing Hydrofera Blue Ready Foam, 4x5 in Discharge Instruction: Apply to wound bed as instructed Secondary Dressing Bordered Gauze, 4x4 in Discharge Instruction: Apply over primary dressing as directed. Secured  With Compression Wrap Compression Stockings Environmental education officer) Signed: 03/11/2021 5:23:39 PM By: Dellie Catholic RN Entered By: Dellie Catholic on 03/11/2021 15:30:33 -------------------------------------------------------------------------------- Vitals Details Patient Name: Date of Service: [name], EDWA RD D. 03/11/2021 2:15 PM Medical Record Number: [id number] Patient Account Number: [id number] Date of Birth/Sex: Treating RN: 01/10/1956 (65 y.o. Collene Gobble Primary Care Lena Gores: Ramonita Lab Other Clinician: Referring Arthea Nobel: Treating Degan Hanser/Extender: Romie Minus in Treatment: 0 Vital Signs Time Taken: 14:45 Temperature (F): 97.7 Height (in): 73 Pulse (bpm): 65 Source: Stated Respiratory Rate (breaths/min): 16 Weight (lbs): 230 Blood Pressure (mmHg): 117/68 Source: Stated Reference Range: 80 - 120 mg / dl Body Mass Index (BMI): 30.3 Electronic Signature(s) Signed: 03/11/2021 5:23:39 PM By: Dellie Catholic RN Entered By: Dellie Catholic on 03/11/2021 14:47:29

## 2021-03-16 ENCOUNTER — Ambulatory Visit: Payer: BC Managed Care – PPO

## 2021-04-01 ENCOUNTER — Other Ambulatory Visit: Payer: Self-pay

## 2021-04-01 ENCOUNTER — Encounter (HOSPITAL_BASED_OUTPATIENT_CLINIC_OR_DEPARTMENT_OTHER): Payer: BC Managed Care – PPO | Attending: Internal Medicine | Admitting: Internal Medicine

## 2021-04-01 DIAGNOSIS — L98498 Non-pressure chronic ulcer of skin of other sites with other specified severity: Secondary | ICD-10-CM | POA: Diagnosis not present

## 2021-04-01 DIAGNOSIS — Y838 Other surgical procedures as the cause of abnormal reaction of the patient, or of later complication, without mention of misadventure at the time of the procedure: Secondary | ICD-10-CM | POA: Insufficient documentation

## 2021-04-01 DIAGNOSIS — T8131XA Disruption of external operation (surgical) wound, not elsewhere classified, initial encounter: Secondary | ICD-10-CM | POA: Insufficient documentation

## 2021-04-01 DIAGNOSIS — Z950 Presence of cardiac pacemaker: Secondary | ICD-10-CM | POA: Insufficient documentation

## 2021-04-01 DIAGNOSIS — B9561 Methicillin susceptible Staphylococcus aureus infection as the cause of diseases classified elsewhere: Secondary | ICD-10-CM | POA: Diagnosis not present

## 2021-04-02 NOTE — Progress Notes (Addendum)
Fincher, Ronn D. ([id number]) Visit Report for 04/01/2021 Arrival Information Details Patient Name: Date of Service: Parnell NCE,  RD D. 04/01/2021 9:00 A M Medical Record Number: [id number] Patient Account Number: [id number] Date of Birth/Sex: Treating RN: [birth year] (66 y.o. Burnadette Pop, Lauren Primary Care Oracio Galen: [facility] Other Clinician: Referring Nnaemeka Samson: Treating Dayanna Pryce/Extender: Romie Minus in Treatment: 3 Visit Information History Since Last Visit Added or deleted any medications: No Patient Arrived: Ambulatory Any new allergies or adverse reactions: No Arrival Time: 09:13 Had a fall or experienced change in No Accompanied By: self activities of daily living that may affect Transfer Assistance: None risk of falls: Patient Identification Verified: Yes Signs or symptoms of abuse/neglect since last visito No Secondary Verification Process Completed: Yes Hospitalized since last visit: No Patient Requires Transmission-Based Precautions: No Implantable device outside of the clinic excluding No Patient Has Alerts: No cellular tissue based products placed in the center since last visit: Has Dressing in Place as Prescribed: Yes Pain Present Now: No Electronic Signature(s) Signed: 04/01/2021 5:03:14 PM By: Rhae Hammock RN Entered By: Rhae Hammock on 04/01/2021 09:14:08 -------------------------------------------------------------------------------- Clinic Level of Care Assessment Details Patient Name: Date of Service: [name], EDWA RD D. 04/01/2021 9:00 A M Medical Record Number: [id number] Patient Account Number: [id number] Date of Birth/Sex: Treating RN: 1955-12-17 (66 y.o. Burnadette Pop, Lauren Primary Care Roya Gieselman: [facility] Other Clinician: Referring Scarleth Brame: Treating Christian Treadway/Extender: Romie Minus in Treatment: 3 Clinic Level of Care Assessment Items TOOL 4 Quantity Score X- 1 0 Use when only an EandM is  performed on FOLLOW-UP visit ASSESSMENTS - Nursing Assessment / Reassessment X- 1 10 Reassessment of Co-morbidities (includes updates in patient status) X- 1 5 Reassessment of Adherence to Treatment Plan ASSESSMENTS - Wound and Skin A ssessment / Reassessment X - Simple Wound Assessment / Reassessment - one wound 1 5 []  - 0 Complex Wound Assessment / Reassessment - multiple wounds X- 1 10 Dermatologic / Skin Assessment (not related to wound area) ASSESSMENTS - Focused Assessment []  - 0 Circumferential Edema Measurements - multi extremities []  - 0 Nutritional Assessment / Counseling / Intervention []  - 0 Lower Extremity Assessment (monofilament, tuning fork, pulses) []  - 0 Peripheral Arterial Disease Assessment (using hand held doppler) ASSESSMENTS - Ostomy and/or Continence Assessment and Care []  - 0 Incontinence Assessment and Management []  - 0 Ostomy Care Assessment and Management (repouching, etc.) PROCESS - Coordination of Care X - Simple Patient / Family Education for ongoing care 1 15 []  - 0 Complex (extensive) Patient / Family Education for ongoing care X- 1 10 Staff obtains Programmer, systems, Records, T Results / Process Orders est []  - 0 Staff telephones HHA, Nursing Homes / Clarify orders / etc []  - 0 Routine Transfer to another Facility (non-emergent condition) []  - 0 Routine Hospital Admission (non-emergent condition) []  - 0 New Admissions / Biomedical engineer / Ordering NPWT Apligraf, etc. , []  - 0 Emergency Hospital Admission (emergent condition) X- 1 10 Simple Discharge Coordination []  - 0 Complex (extensive) Discharge Coordination PROCESS - Special Needs []  - 0 Pediatric / Minor Patient Management []  - 0 Isolation Patient Management []  - 0 Hearing / Language / Visual special needs []  - 0 Assessment of Community assistance (transportation, D/C planning, etc.) []  - 0 Additional assistance / Altered mentation []  - 0 Support Surface(s) Assessment  (bed, cushion, seat, etc.) INTERVENTIONS - Wound Cleansing / Measurement X - Simple Wound Cleansing - one wound 1 5 []  - 0 Complex Wound Cleansing -  multiple wounds X- 1 5 Wound Imaging (photographs - any number of wounds) []  - 0 Wound Tracing (instead of photographs) X- 1 5 Simple Wound Measurement - one wound []  - 0 Complex Wound Measurement - multiple wounds INTERVENTIONS - Wound Dressings X - Small Wound Dressing one or multiple wounds 1 10 []  - 0 Medium Wound Dressing one or multiple wounds []  - 0 Large Wound Dressing one or multiple wounds X- 1 5 Application of Medications - topical []  - 0 Application of Medications - injection INTERVENTIONS - Miscellaneous []  - 0 External ear exam []  - 0 Specimen Collection (cultures, biopsies, blood, body fluids, etc.) []  - 0 Specimen(s) / Culture(s) sent or taken to Lab for analysis []  - 0 Patient Transfer (multiple staff / Civil Service fast streamer / Similar devices) []  - 0 Simple Staple / Suture removal (25 or less) []  - 0 Complex Staple / Suture removal (26 or more) []  - 0 Hypo / Hyperglycemic Management (close monitor of Blood Glucose) []  - 0 Ankle / Brachial Index (ABI) - do not check if billed separately X- 1 5 Vital Signs Has the patient been seen at the hospital within the last three years: Yes Total Score: 100 Level Of Care: New/Established - Level 3 Electronic Signature(s) Signed: 04/01/2021 5:03:14 PM By: Rhae Hammock RN Entered By: Rhae Hammock on 04/01/2021 09:39:13 -------------------------------------------------------------------------------- Encounter Discharge Information Details Patient Name: Date of Service: [name], EDWA RD D. 04/01/2021 9:00 A M Medical Record Number: [id number] Patient Account Number: [id number] Date of Birth/Sex: Treating RN: 1955/04/22 (65 y.o. Burnadette Pop, Lauren Primary Care Salam Micucci: [facility] Other Clinician: Referring Makynleigh Breslin: Treating Gunnar Hereford/Extender: Romie Minus in Treatment: 3 Encounter Discharge Information Items Discharge Condition: Stable Ambulatory Status: Ambulatory Discharge Destination: Home Transportation: Private Auto Accompanied By: self Schedule Follow-up Appointment: Yes Clinical Summary of Care: Patient Declined Electronic Signature(s) Signed: 04/01/2021 5:03:14 PM By: Rhae Hammock RN Entered By: Rhae Hammock on 04/01/2021 09:40:12 -------------------------------------------------------------------------------- Lower Extremity Assessment Details Patient Name: Date of Service: [name], EDWA RD D. 04/01/2021 9:00 A M Medical Record Number: [id number] Patient Account Number: [id number] Date of Birth/Sex: Treating RN: 1955-10-22 (66 y.o. Burnadette Pop, Lauren Primary Care Gera Inboden: [facility] Other Clinician: Referring Kiyo Heal: Treating Maicey Barrientez/Extender: Romie Minus in Treatment: 3 Electronic Signature(s) Signed: 04/01/2021 5:03:14 PM By: Rhae Hammock RN Entered By: Rhae Hammock on 04/01/2021 09:15:03 -------------------------------------------------------------------------------- Multi Wound Chart Details Patient Name: Date of Service: [name], EDWA RD D. 04/01/2021 9:00 A M Medical Record Number: [id number] Patient Account Number: [id number] Date of Birth/Sex: Treating RN: [birth year] (66 y.o. Burnadette Pop, Lauren Primary Care Harue Pribble: [facility] Other Clinician: Referring Roxi Hlavaty: Treating Minnette Merida/Extender: Romie Minus in Treatment: 3 Vital Signs Height(in): 73 Pulse(bpm): 39 Weight(lbs): 230 Blood Pressure(mmHg): 116/74 Body Mass Index(BMI): 30 Temperature(F): 97.7 Respiratory Rate(breaths/min): 17 Photos: [1:No Photos] [N/A:N/A] Left Chest Left Chest N/A Wound Location: Surgical Injury Surgical Injury N/A Wounding Event: T be determined o T be determined o N/A Primary Etiology: 02/05/2021 02/05/2021 N/A Date Acquired: 3 3 N/A Weeks  of Treatment: Open Open N/A Wound Status: 1x0.4x0.3 1x0.4x0.3 N/A Measurements L x W x D (cm) 0.314 0.314 N/A A (cm) : rea 0.094 0.094 N/A Volume (cm) : 90.50% 90.50% N/A % Reduction in A rea: 98.90% 98.90% N/A % Reduction in Volume: 4 Starting Position 1 (o'clock): 7 Ending Position 1 (o'clock): 0.5 Maximum Distance 1 (cm): Yes N/A N/A Undermining: Full Thickness Without Exposed Full Thickness Without Exposed N/A Classification: Support  Structures Support Structures Large Large N/A Exudate Amount: Serous Serous N/A Exudate Type: Physiological scientist N/A Exudate Color: Distinct, outline attached N/A N/A Wound Margin: Large (67-100%) N/A N/A Granulation Amount: Red N/A N/A Granulation Quality: None Present (0%) N/A N/A Necrotic Amount: Fat Layer (Subcutaneous Tissue): Yes N/A N/A Exposed Structures: Fascia: No Tendon: No Muscle: No Joint: No Bone: No None N/A N/A Epithelialization: Treatment Notes Wound #1 (Chest) Wound Laterality: Left Cleanser Wound Cleanser Discharge Instruction: Cleanse the wound with wound cleanser prior to applying a clean dressing using gauze sponges, not tissue or cotton balls. Peri-Wound Care Skin Prep Discharge Instruction: Use skin prep as directed Topical Primary Dressing Promogran Prisma Matrix, 4.34 (sq in) (silver collagen) Discharge Instruction: Moisten collagen with saline or hydrogel Secondary Dressing Bordered Gauze, 4x4 in Discharge Instruction: Apply over primary dressing as directed. Secured With Compression Wrap Compression Stockings Environmental education officer) Signed: 04/01/2021 5:03:14 PM By: Rhae Hammock RN Signed: 04/02/2021 4:38:12 PM By: Linton Ham MD Entered By: Linton Ham on 04/01/2021 10:03:25 -------------------------------------------------------------------------------- Multi-Disciplinary Care Plan Details Patient Name: Date of Service: [name], EDWA RD D. 04/01/2021 9:00 A M Medical  Record Number: [id number] Patient Account Number: [id number] Date of Birth/Sex: Treating RN: Nov 27, 1955 (66 y.o. Burnadette Pop, Lauren Primary Care Takeshi Teasdale: [facility] Other Clinician: Referring Isobella Ascher: Treating Elisandro Jarrett/Extender: Romie Minus in Treatment: 3 Active Inactive Electronic Signature(s) Signed: 12/25/2021 11:55:58 AM By: Rhae Hammock RN Previous Signature: 04/01/2021 5:03:14 PM Version By: Rhae Hammock RN Entered By: Rhae Hammock on 04/21/2021 09:05:50 -------------------------------------------------------------------------------- Pain Assessment Details Patient Name: Date of Service: [name], EDWA RD D. 04/01/2021 9:00 A M Medical Record Number: [id number] Patient Account Number: [id number] Date of Birth/Sex: Treating RN: 1956/01/22 (66 y.o. Burnadette Pop, Lauren Primary Care Malaky Tetrault: Ramonita [facility] Other Clinician: Referring Estell Dillinger: Treating Cortez Steelman/Extender: Romie Minus in Treatment: 3 Active Problems Location of Pain Severity and Description of Pain Patient Has Paino No Site Locations Pain Management and Medication Current Pain Management: Electronic Signature(s) Signed: 04/01/2021 5:03:14 PM By: Rhae Hammock RN Entered By: Rhae Hammock on 04/01/2021 09:14:57 -------------------------------------------------------------------------------- Patient/Caregiver Education Details Patient Name: Date of Service: [name], EDWA RD D. 1/4/2023andnbsp9:00 A M Medical Record Number: [id number] Patient Account Number: [id number] Date of Birth/Gender: Treating RN: March 22, 1956 (66 y.o. Burnadette Pop, Lauren Primary Care Physician: Ramonita [facility] Other Clinician: Referring Physician: Treating Physician/Extender: Romie Minus in Treatment: 3 Education Assessment Education Provided To: Patient Education Topics Provided Welcome T [facility] [place]: o Methods: Explain/Verbal Responses:  State content correctly Electronic Signature(s) Signed: 04/01/2021 5:03:14 PM By: Rhae Hammock RN Entered By: Rhae Hammock on 04/01/2021 09:38:31 -------------------------------------------------------------------------------- Wound Assessment Details Patient Name: Date of Service: [name], EDWA RD D. 04/01/2021 9:00 A M Medical Record Number: [id number] Patient Account Number: [id number] Date of Birth/Sex: Treating RN: December 21, 1955 (66 y.o. Burnadette Pop, [place] Primary Care Domingue Coltrain: Ramonita Lab Other Clinician: Referring Andric Kerce: Treating Jacorian Golaszewski/Extender: Romie Minus in Treatment: 3 Wound Status Wound Number: 1 Primary Etiology: T be determined o Wound Location: Left Chest Wound Status: Open Wounding Event: Surgical Injury Date Acquired: 02/05/2021 Weeks Of Treatment: 3 Clustered Wound: No Wound Measurements Length: (cm) 1 Width: (cm) 0.4 Depth: (cm) 0.3 Area: (cm) 0.314 Volume: (cm) 0.094 % Reduction in Area: 90.5% % Reduction in Volume: 98.9% Epithelialization: None Tunneling: No Undermining: Yes Starting Position (o'clock): 4 Ending Position (o'clock): 7 Maximum Distance: (cm) 0.5 Wound Description Classification: Full Thickness Without Exposed Support Structures Wound Margin: Distinct, outline attached  Exudate Amount: Large Exudate Type: Serous Exudate Color: amber Foul Odor After Cleansing: No Slough/Fibrino No Wound Bed Granulation Amount: Large (67-100%) Exposed Structure Granulation Quality: Red Fascia Exposed: No Necrotic Amount: None Present (0%) Fat Layer (Subcutaneous Tissue) Exposed: Yes Tendon Exposed: No Muscle Exposed: No Joint Exposed: No Bone Exposed: No Electronic Signature(s) Signed: 04/01/2021 5:03:14 PM By: Rhae Hammock RN Entered By: Rhae Hammock on 04/01/2021 09:18:46 -------------------------------------------------------------------------------- Wound Assessment Details Patient  Name: Date of Service: [name], EDWA RD D. 04/01/2021 9:00 A M Medical Record Number: [id number] Patient Account Number: [id number] Date of Birth/Sex: Treating RN: 1955-05-07 (66 y.o. Burnadette Pop, [place] Primary Care Aaniyah Strohm: [facility]onita Lab Other Clinician: Referring Coni Homesley: Treating Olson Lucarelli/Extender: Romie Minus in Treatment: 3 Wound Status Wound Number: 1 Primary Etiology: T be determined o Wound Location: Left Chest Wound Status: Healed - Epithelialized Wounding Event: Surgical Injury Date Acquired: 02/05/2021 Weeks Of Treatment: 3 Clustered Wound: No Wound Measurements Length: (cm) Width: (cm) Depth: (cm) Area: (cm) Volume: (cm) 0 % Reduction in Area: 100% 0 % Reduction in Volume: 100% 0 Epithelialization: None 0 Tunneling: No 0 Undermining: Yes Starting Position (o'clock): 4 Ending Position (o'clock): 7 Maximum Distance: (cm) 0.5 Wound Description Classification: Full Thickness Without Exposed Support Structures Wound Margin: Distinct, outline attached Exudate Amount: Large Exudate Type: Serous Exudate Color: amber Foul Odor After Cleansing: No Slough/Fibrino No Wound Bed Granulation Amount: Large (67-100%) Exposed Structure Granulation Quality: Red Fascia Exposed: No Necrotic Amount: None Present (0%) Fat Layer (Subcutaneous Tissue) Exposed: Yes Tendon Exposed: No Muscle Exposed: No Joint Exposed: No Bone Exposed: No Treatment Notes Wound #1 (Chest) Wound Laterality: Left Cleanser Wound Cleanser Discharge Instruction: Cleanse the wound with wound cleanser prior to applying a clean dressing using gauze sponges, not tissue or cotton balls. Peri-Wound Care Skin Prep Discharge Instruction: Use skin prep as directed Topical Primary Dressing Promogran Prisma Matrix, 4.34 (sq in) (silver collagen) Discharge Instruction: Moisten collagen with saline or hydrogel Secondary Dressing Bordered Gauze, 4x4 in Discharge Instruction:  Apply over primary dressing as directed. Secured With Compression Wrap Compression Stockings Environmental education officer) Signed: 12/25/2021 11:55:58 AM By: Rhae Hammock RN Previous Signature: 04/01/2021 5:03:14 PM Version By: Rhae Hammock RN Previous Signature: 04/01/2021 5:49:29 PM Version By: Levan Hurst RN, BSN Entered By: Rhae Hammock on 04/21/2021 09:05:33 -------------------------------------------------------------------------------- [place] Details Patient Name: Date of Service: [name], EDWA RD D. 04/01/2021 9:00 A M Medical Record Number: [id number] Patient Account Number: [id number] Date of Birth/Sex: Treating RN: Feb 09, 1956 (66 y.o. Burnadette Pop, Lauren Primary Care Wane Mollett: [facility] Other Clinician: Referring Kamyra Schroeck: Treating Nellie Chevalier/Extender: Romie Minus in Treatment: 3 Vital Signs Time Taken: 09:14 Temperature (F): 97.7 Height (in): 73 Pulse (bpm): 56 Weight (lbs): 230 Respiratory Rate (breaths/min): 17 Body Mass Index (BMI): 30.3 Blood Pressure (mmHg): 116/74 Reference Range: 80 - 120 mg / dl Electronic Signature(s) Signed: 04/01/2021 5:03:14 PM By: Rhae Hammock RN Entered By: Rhae Hammock on 04/01/2021 09:14:47

## 2021-04-02 NOTE — Progress Notes (Signed)
Pannone, Rollin D. ([id number]) Visit Report for 04/01/2021 HPI Details Patient Name: Date of Service: [name], [place] RD D. 04/01/2021 9:00 A M Medical Record Number: [id number] Patient Account Number: [id number] Date of Birth/Sex: Treating RN: December 25, 1955 (66 y.o. 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 According to the cardiology records he developed a staph abscess in 2017 although I am not sure exactly where this is. He required IV antibiotics. He recently had a generator change on 12/29/2020. He developed an open wound with drainage. Dr. Darene Lamer aylor under took a removal of the pacemaker system on 11/9 apparently it is no longer indicated for the type of bradycardia he had. The leads were retrieved under general anesthesia. Culture showed methicillin sensitive staph aureus and he is now on doxycycline. The wound was closed with sutures however unfortunately this dehisced. He has been packing this with wet-to-dry dressing still taking doxycycline as near as I can see for the past month Past medical history includes pacemaker removal on 02/06/2021 was discussed CandS showed rare staph methicillin sensitive staph aureus he has been given doxycycline. Apparently after extraction he developed a seroma or hematoma that became secondarily infected. 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 Much smaller healthy looking tissue. It sounds as though he has been just layering the [facility] over the top of this for about the past week he was packing  it up till then. He is completed antibiotics/doxycycline for the pacemaker pocket infection. Electronic Signature(s) Signed: 04/02/2021 4:38:12 PM By: Linton Ham MD Entered By: Linton Ham on 04/01/2021 10:04:21 -------------------------------------------------------------------------------- Physical Exam Details Patient Name: Date of Service: [name], EDWA RD D. 04/01/2021 9:00 A M Medical Record Number: [id number] Patient Account Number: [id number] Date of Birth/Sex: Treating RN: Sep 25, 1955 (66 y.o. Burnadette Pop, Lauren Primary Care Provider: Ramonita Lab Other Clinician: Referring Provider: Treating Provider/Extender: Romie Minus in Treatment: 3 Constitutional Sitting or standing Blood Pressure is within target range for patient.. Pulse regular and within target range for patient.Marland Kitchen Respirations regular, non-labored and within target range.. Temperature is normal and within the target range for the patient.Marland Kitchen Appears in no distress. Notes Wound exam; left anterior upper chest. The wound has 2 to 3 mm of depth wound surface looks healthy. He still has his 6:00 about 0.8 cm of tunneling to all of this is considerably better than last time. There is no surrounding erythema tenderness or crepitus to palpation. Electronic Signature(s) Signed: 04/02/2021 4:38:12 PM By: Linton Ham MD Entered By: Linton Ham on 04/01/2021 10:05:16 -------------------------------------------------------------------------------- Physician Orders Details Patient Name: Date of Service: [name], EDWA RD D. 04/01/2021 9:00 A M Medical Record Number: [id number] Patient Account Number: [id number] Date of Birth/Sex: Treating RN: 07/10/1955 (66 y.o. Burnadette Pop, Lauren Primary Care Provider: Ramonita Lab Other Clinician: Referring Provider: Treating Provider/Extender: Romie Minus in Treatment: 3 Verbal / Phone Orders: No Diagnosis Coding Follow-up  Appointments Return appointment in 3 weeks. - Dr. Dellia Nims Bathing/ Shower/ Hygiene May shower with protection but do not get wound dressing(s) wet. Wound Treatment Wound #1 - Chest Wound Laterality: Left Cleanser: Wound Cleanser (Generic)  1 x Per Day/30 Days Discharge Instructions: Cleanse the wound with wound cleanser prior to applying a clean dressing using gauze sponges, not tissue or cotton balls. Peri-Wound Care: Skin Prep (Generic) 1 x Per Day/30 Days Discharge Instructions: Use skin prep as directed Prim Dressing: Promogran Prisma Matrix, 4.34 (sq in) (silver collagen) 1 x Per Day/30 Days ary Discharge Instructions: Moisten collagen with saline or hydrogel Secondary Dressing: Bordered Gauze, 4x4 in (Generic) 1 x Per Day/30 Days Discharge Instructions: Apply over primary dressing as directed. Electronic Signature(s) Signed: 04/01/2021 5:03:14 PM By: Rhae Hammock RN Signed: 04/02/2021 4:38:12 PM By: Linton Ham MD Entered By: Rhae Hammock on 04/01/2021 09:34:52 -------------------------------------------------------------------------------- Problem List Details Patient Name: Date of Service: [name], EDWA RD D. 04/01/2021 9:00 A M Medical Record Number: [id number] Patient Account Number: [id number] Date of Birth/Sex: Treating RN: 06/10/1955 (66 y.o. Burnadette Pop, Lauren Primary Care Provider: Ramonita Lab Other Clinician: Referring Provider: Treating Provider/Extender: Romie Minus in Treatment: 3 Active Problems ICD-10 Encounter Code Description Active Date MDM Diagnosis L98.498 Non-pressure chronic ulcer of skin of other sites with other specified severity 03/11/2021 No Yes T81.31XD Disruption of external operation (surgical) wound, not elsewhere classified, 03/11/2021 No Yes subsequent encounter B95.61 Methicillin susceptible Staphylococcus aureus infection as the cause of 03/11/2021 No Yes diseases classified elsewhere Inactive  Problems Resolved Problems Electronic Signature(s) Signed: 04/02/2021 4:38:12 PM By: Linton Ham MD Entered By: Linton Ham on 04/01/2021 10:03:18 -------------------------------------------------------------------------------- Progress Note Details Patient Name: Date of Service: [name], EDWA RD D. 04/01/2021 9:00 A M Medical Record Number: [id number] Patient Account Number: [id number] Date of Birth/Sex: Treating RN: 02-Mar-1956 (66 y.o. 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 According to the cardiology records he developed a staph abscess in 2017 although I am not sure exactly where this is. He required IV antibiotics. He recently had a generator change on 12/29/2020. He developed an open wound with drainage. Dr. Darene Lamer aylor under took a removal of the pacemaker system on 11/9 apparently it is no longer indicated for the type of bradycardia he had. The leads were retrieved under general anesthesia. Culture showed methicillin sensitive staph aureus and he is now on doxycycline. The wound was closed with sutures however unfortunately this dehisced. He has been packing this with wet-to-dry dressing still taking doxycycline as near as I can see for the past month Past medical history includes pacemaker removal on 02/06/2021 was discussed CandS showed rare staph methicillin sensitive staph aureus he has been given doxycycline. Apparently after extraction he developed a seroma or hematoma that became secondarily infected. 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 Much smaller healthy looking tissue. It sounds as though he has been just layering the [facility] over the top of this for about the past week he was packing it up till then. He is completed antibiotics/doxycycline for the pacemaker pocket infection. Objective Constitutional Sitting or standing Blood Pressure is within target range for patient.. Pulse regular and within target range for patient.Marland Kitchen Respirations regular, non-labored and within target range.. Temperature is normal and within the target range for the patient.Marland Kitchen Appears in  no distress. Vitals Time Taken: 9:14 AM, Height: 73 in, Weight: 230 lbs, BMI: 30.3, Temperature: 97.7 F, Pulse: 56 bpm, Respiratory Rate: 17 breaths/min, Blood Pressure: 116/74 mmHg. General Notes: Wound exam; left anterior upper chest. The wound has 2 to 3 mm of depth wound surface looks healthy. He still has his 6:00 about 0.8 cm of tunneling to all of this is considerably better than last time. There is no surrounding erythema tenderness or crepitus to palpation. Integumentary (Hair, Skin) Wound #1 status is Open. Original cause of wound was Surgical Injury. The date acquired was: 02/05/2021. The wound has been in treatment 3 weeks. The wound is located on the Left Chest. The wound measures 1cm length x 0.4cm width x 0.3cm depth; 0.314cm^2 area and 0.094cm^3 volume. There is Fat Layer (Subcutaneous Tissue) exposed. There is no tunneling noted, however, there is undermining starting at 4:00 and ending at 7:00 with a maximum distance of 0.5cm. There is a large amount of serous drainage noted. The wound margin is distinct with the outline attached to the wound base. There is large (67-100%) red granulation within the wound bed. There is no necrotic tissue within the wound bed. Wound #1 status is Open. Original cause of wound was Surgical Injury. The date acquired was: 02/05/2021. The wound has been in treatment 3 weeks. The wound is  located on the Left Chest. The wound measures 1cm length x 0.4cm width x 0.3cm depth; 0.314cm^2 area and 0.094cm^3 volume. There is a large amount of serous drainage noted. Assessment Active Problems ICD-10 Non-pressure chronic ulcer of skin of other sites with other specified severity Disruption of external operation (surgical) wound, not elsewhere classified, subsequent encounter Methicillin susceptible Staphylococcus aureus infection as the cause of diseases classified elsewhere Plan Follow-up Appointments: Return appointment in 3 weeks. - Dr. Dellia Nims Bathing/ Shower/ Hygiene: May shower with protection but do not get wound dressing(s) wet. WOUND #1: - Chest Wound Laterality: Left Cleanser: Wound Cleanser (Generic) 1 x Per Day/30 Days Discharge Instructions: Cleanse the wound with wound cleanser prior to applying a clean dressing using gauze sponges, not tissue or cotton balls. Peri-Wound Care: Skin Prep (Generic) 1 x Per Day/30 Days Discharge Instructions: Use skin prep as directed Prim Dressing: Promogran Prisma Matrix, 4.34 (sq in) (silver collagen) 1 x Per Day/30 Days ary Discharge Instructions: Moisten collagen with saline or hydrogel Secondary Dressing: Bordered Gauze, 4x4 in (Generic) 1 x Per Day/30 Days Discharge Instructions: Apply over primary dressing as directed. 1. I changed the primary dressing to silver collagen moistened with hydrogel or K-Y jelly. I told him that this can be changed every 2 to 3 days did not need to be changed daily. 2. Advised him that I think it is still important to keep the dressing packed especially in the undermining area. He has had considerable improvement and I have no reason to believe that this is not going to close 3. I gave an appointment for 3 weeks but asked him to call us if this actually does close. 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 He is no longer on antibiotics Electronic  Signature(s) Signed: 04/02/2021 4:38:12 PM By: Linton Ham MD Entered By: Linton Ham on 04/01/2021 10:06:33 -------------------------------------------------------------------------------- SuperBill Details Patient Name: Date of Service: [name], EDWA RD D. 04/01/2021 Medical Record Number: [id number] Patient Account Number: [id number] Date of Birth/Sex: Treating RN: [birth year] (66 y.o. Burnadette Pop, Lauren Primary Care Provider: Ramonita Lab Other  Clinician: Referring Provider: Treating Provider/Extender: Romie Minus in Treatment: 3 Diagnosis Coding ICD-10 Codes Code Description [phone] Non-pressure chronic ulcer of skin of other sites with other specified severity T81.31XD Disruption of external operation (surgical) wound, not elsewhere classified, subsequent encounter B95.61 Methicillin susceptible Staphylococcus aureus infection as the cause of diseases classified elsewhere Facility Procedures CPT4 Code: 33545625 Description: 99213 - WOUND CARE VISIT-LEV 3 EST PT Modifier: Quantity: 1 Physician Procedures : CPT4 Code Description Modifier [id number] - WC PHYS LEVEL 3 - EST PT ICD-10 Diagnosis Description L98.498 Non-pressure chronic ulcer of skin of other sites with other specified severity T81.31XD Disruption of external operation (surgical) wound, not  elsewhere classified, subsequent encounter B95.61 Methicillin susceptible Staphylococcus aureus infection as the cause of diseases classified elsewhe Quantity: 1 re Electronic Signature(s) Signed: 04/02/2021 4:38:12 PM By: Linton Ham MD Entered By: Linton Ham on 04/01/2021 10:06:52

## 2021-04-07 ENCOUNTER — Encounter: Payer: BC Managed Care – PPO | Admitting: Internal Medicine

## 2021-04-13 ENCOUNTER — Encounter: Payer: Self-pay | Admitting: Internal Medicine

## 2021-04-20 ENCOUNTER — Ambulatory Visit: Payer: BC Managed Care – PPO | Admitting: Internal Medicine

## 2021-04-20 VITALS — BP 122/78 | HR 55 | Ht 73.0 in | Wt 232.2 lb

## 2021-04-20 DIAGNOSIS — I495 Sick sinus syndrome: Secondary | ICD-10-CM | POA: Diagnosis not present

## 2021-04-20 DIAGNOSIS — Z950 Presence of cardiac pacemaker: Secondary | ICD-10-CM

## 2021-04-20 NOTE — Patient Instructions (Signed)
Medication Instructions:  Your physician recommends that you continue on your current medications as directed. Please refer to the Current Medication list given to you today.  *If you need a refill on your cardiac medications before your next appointment, please call your pharmacy*   Lab Work: None ordered.  If you have labs (blood work) drawn today and your tests are completely normal, you will receive your results only by: Reading (if you have MyChart) OR A paper copy in the mail If you have any lab test that is abnormal or we need to change your treatment, we will call you to review the results.   Testing/Procedures: None ordered.    Follow-Up: At [facility], you and your health needs are our priority.  As part of our continuing mission to provide you with exceptional heart care, we have created designated Provider Care Teams.  These Care Teams include your primary Cardiologist (physician) and Advanced Practice Providers (APPs -  Physician Assistants and Nurse Practitioners) who all work together to provide you with the care you need, when you need it.  We recommend signing up for the patient portal called "MyChart".  Sign up information is provided on this After Visit Summary.  MyChart is used to connect with patients for Virtual Visits (Telemedicine).  Patients are able to view lab/test results, encounter notes, upcoming appointments, etc.  Non-urgent messages can be sent to your provider as well.   To learn more about what you can do with MyChart, go to [email or web address].    Your next appointment:   Follow up as needed with Dr Caryl Comes

## 2021-04-20 NOTE — Progress Notes (Addendum)
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   He also has a history of symptomatic PVCs which have been ameliorated by the pacing.  History of staph aureus bacteremia 2018  2/2 neck surgery   He underwent generator replacement by Dr. Elliot Cousin that was then complicated by infection requiring explantation. This further was complicated by a seroma that drained and a self-referral to the wound clinic which was associated with rapid healing of the incision  He asks the question whether the device should have been replaced at all given the questionable indication for its implantation, i.e. nocturnal pausing which over time has become increasingly dismissed is an indication for pacing, and the device simply abandoned.   Reviewed his sleep study report.  AHI was 9.     Date Cr K Hgb  11/19 1.1 4.2 16.1  11/20  1.1 4.9 15.7  12/21 1.1 4.6 Admitted          DATE TEST EF   1/18 TEE  55-60 %            Past Medical History:  Diagnosis Date   Basal cell carcinoma (BCC) of cheek 05/2011   RIGHT-REMOVED   Cancer ([place])    Hyperlipidemia    Internal hemorrhoids    Lumbar disc disease    Presence of permanent cardiac pacemaker    medtronic Oct 2010   PVC (premature ventricular contraction)    Sinus node dysfunction [facility])     Past Surgical History:  Procedure Laterality Date   ANAL FISSURE REPAIR WITH PARTIAL LATERAL SPHINCTEROTOMY  1998   BACK SURGERY  12/18/2002   CHOLECYSTECTOMY  01/2003   COLONOSCOPY  10/19/2012   COLONOSCOPY WITH PROPOFOL N/A 11/25/2017   Procedure: COLONOSCOPY WITH PROPOFOL;  Surgeon: Manya Silvas, MD;  Location: [facility] ENDOSCOPY;  Service: Endoscopy;   Laterality: N/A;   ESOPHAGOSCOPY N/A 03/23/2016   Procedure: ESOPHAGOSCOPY;  Surgeon: Izora Gala, MD;  Location: [place];  Service: ENT;  Laterality: N/A;   EXCISIONAL HEMORRHOIDECTOMY     WITH FISTULECTOMY   GENERATOR REMOVAL N/A 02/06/2021   Procedure: PACEMAKER SYSTEM REMOVAL;  Surgeon: Evans Dicaprio, MD;  Location: [place];  Service: Cardiovascular;  Laterality: N/A;   [place]  11/2003   INSERT / REPLACE / REMOVE PACEMAKER     INSERT/REPLACE/REMOVE PACEMAKER  01/24/2009   NECK SURGERY     PLACEMENT OF [place]  07/21/2015   PPM GENERATOR CHANGEOUT N/A 12/29/2020   Procedure: PPM GENERATOR CHANGEOUT;  Surgeon: Evans Cancio, MD;  Location: [place] CV LAB;  Service: Cardiovascular;  Laterality: N/A;   TEE WITHOUT CARDIOVERSION N/A 03/30/2016   Procedure: TRANSESOPHAGEAL ECHOCARDIOGRAM (TEE);  Surgeon: Sanda Thien Berka, MD;  Location: [facility] ENDOSCOPY;  Service: Cardiovascular;  Laterality: N/A;   WOUND EXPLORATION N/A 03/23/2016   Procedure: Incision and Drainage of Cervical Wound;  Surgeon: Leeroy Cha, MD;  Location: [place];  Service: Neurosurgery;  Laterality: N/A;    Current Outpatient Medications  Medication Sig Dispense Refill   colestipol (COLESTID) 1 g tablet Take 2 g by mouth daily.     tetrahydrozoline-zinc (VISINE-AC) 0.05-0.25 % ophthalmic solution Place 2 drops  into both eyes 3 (three) times daily as needed (eye allergies).     Current Facility-Administered Medications  Medication Dose Route Frequency Provider Last Rate Last Admin   triamcinolone acetonide (KENALOG) 10 MG/ML injection 10 mg  10 mg Other Once Landis Martins, DPM        No Known Allergies     Review of Systems negative except from HPI and PMH  Physical Exam BP 122/78    Pulse (!) 55    Ht 6\' 1"  (1.854 m)    Wt 232 lb 3.2 oz (105.3 kg)    SpO2 99%    BMI 30.64 kg/m  Well developed and well nourished in no acute distress Wound well healed   ECG  sinus @  55  Assessment and  Plan  SINUS NODE Dysfunction   Medtronic pacemaker  s/p infections extraction  Nocturnal bradycardia  History of post cervical neck surgery 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 Mia Creek had a number of concerns as outlined above.  1 was a question of whether the generator should have been replaced at all given the questionable indication.  I told him that we often find ourselves in a difficult position of deferring to reimplantation especially if the implant was done by thoughtful and experienced providers as was the case with him with its implantation by Dr. Mylinda Latina.  There have been a discussion to not proceed with generator replacement, it would have been appropriate discussion at that juncture given the patient's young age to consider explantation of the system as opposed to simply abandoning it.  Given Dr. Tanna Furry expertise and experience I would have favored explantation, certainly other providers with less ready access to extraction expertise and experience might have simply recommended abandoning.  The next concerns related to the hematoma that developed postoperatively that he thinks was responsible for the infection and remembering that pressure was held repeatedly at 5 minutes of time after the procedure.  I was not there but I suspect is related to superficial bleeding as opposed to pocket bleeding.  There was however some "squishiness "which almost certainly represented some pocket hematoma.  Following the removal of the suture there was an subsequent leakage of the seroma that went on for some time prompting his self referral to the wound clinic which then was associated with a rapid healing.  I told him I appreciated his relating the techniques used by wound clinic and I will reach out to Dr Dellia Nims    All of these untoward events are legitimately frustrating.  At this point he is having no symptoms.  We will not proceed with device generator implantation.  The PVCs  that previously were rendered less symptomatic in the context of pacing have not again raised their head.  We will follow for recurrent symptoms.

## 2021-04-22 ENCOUNTER — Encounter (HOSPITAL_BASED_OUTPATIENT_CLINIC_OR_DEPARTMENT_OTHER): Payer: BC Managed Care – PPO | Admitting: Internal Medicine

## 2021-05-01 ENCOUNTER — Encounter: Payer: Self-pay | Admitting: Surgery

## 2021-05-01 NOTE — Progress Notes (Signed)
TCTS:  I provided surgical backup for this patient for 67 minutes for pacemaker extraction in the operating room.  Gaye Pollack, MD.

## 2022-05-06 IMAGING — CR DG CHEST 2V
2 series · 2 of 2 positions shown · non-contrast
Comparison: Chest x-ray 04/29/2016.

CLINICAL DATA: 65-year-old male with history of pacemaker
infection.

EXAM:
CHEST - 2 VIEW

[chest pa]
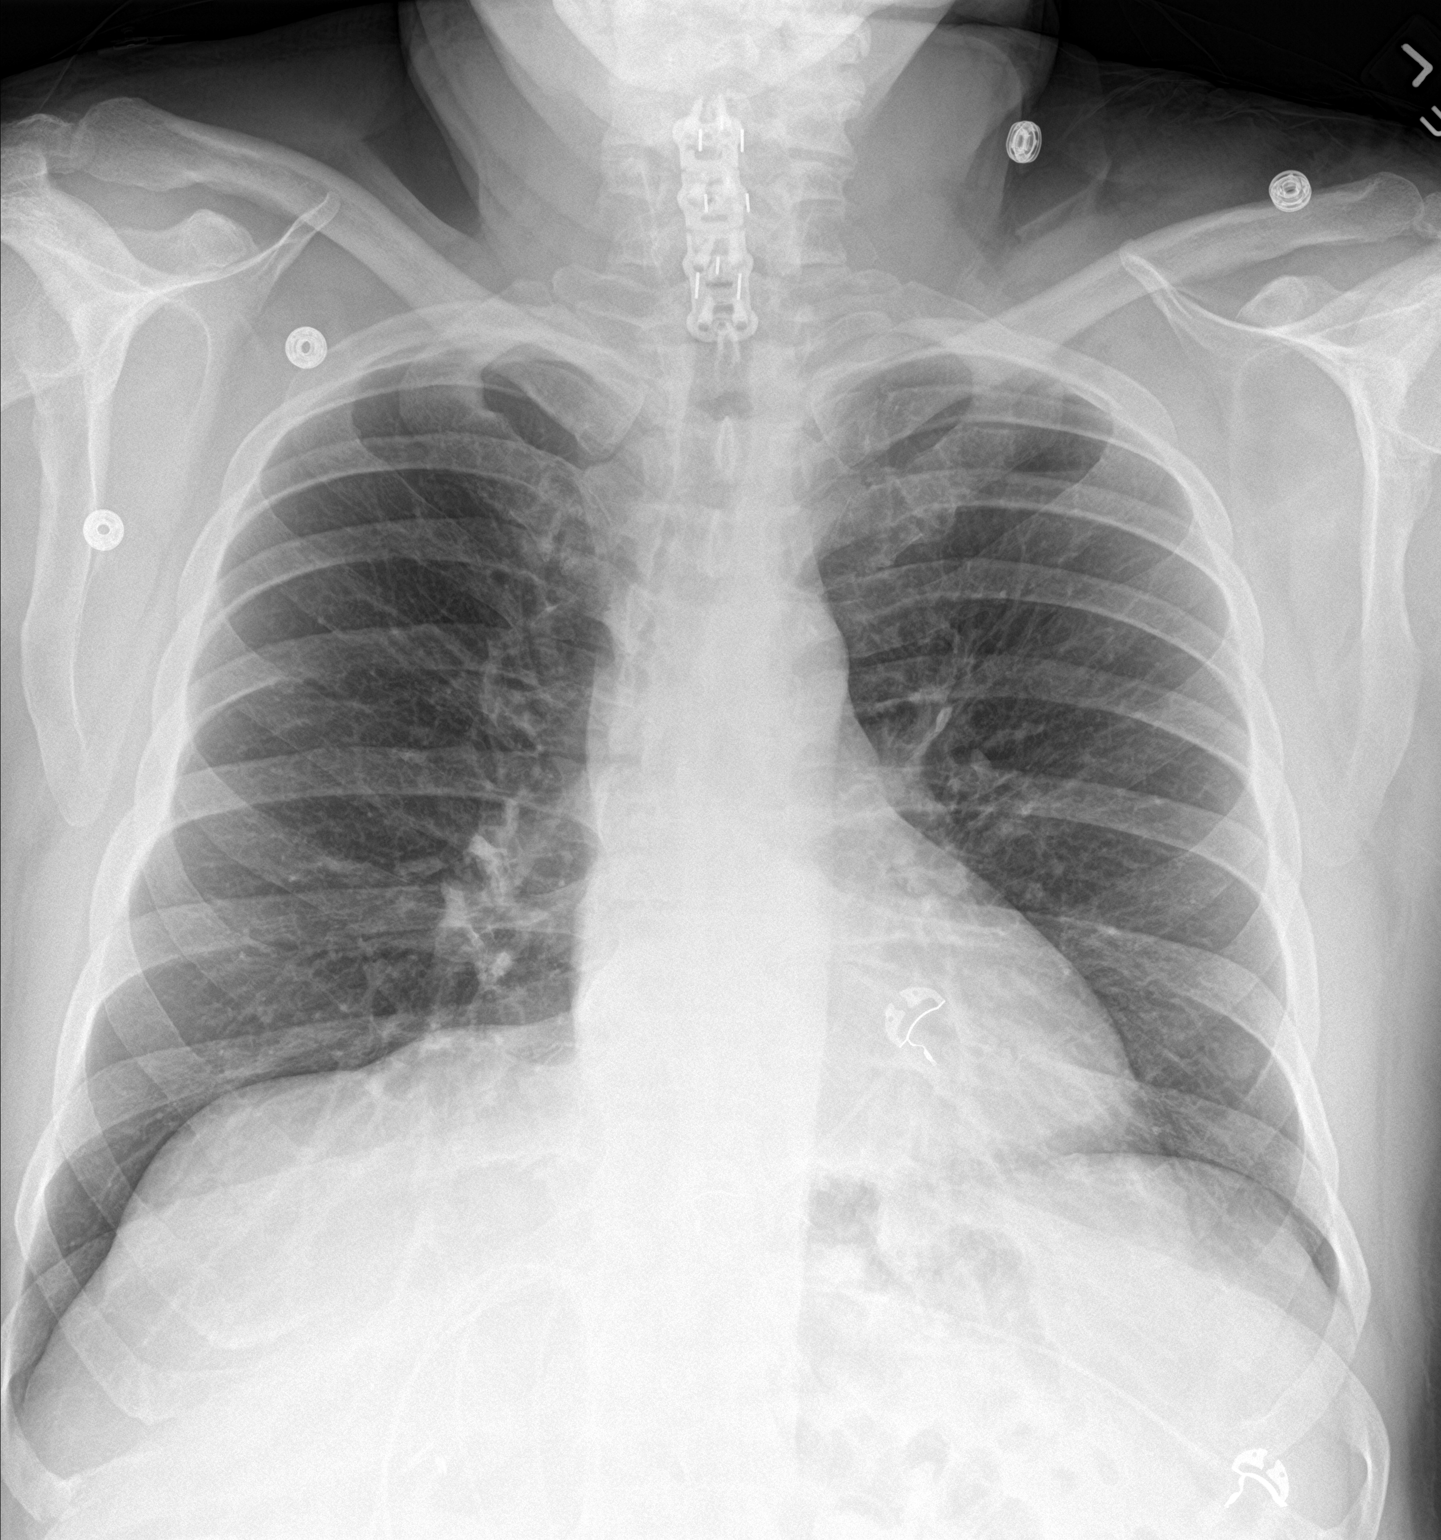

[chest lat]
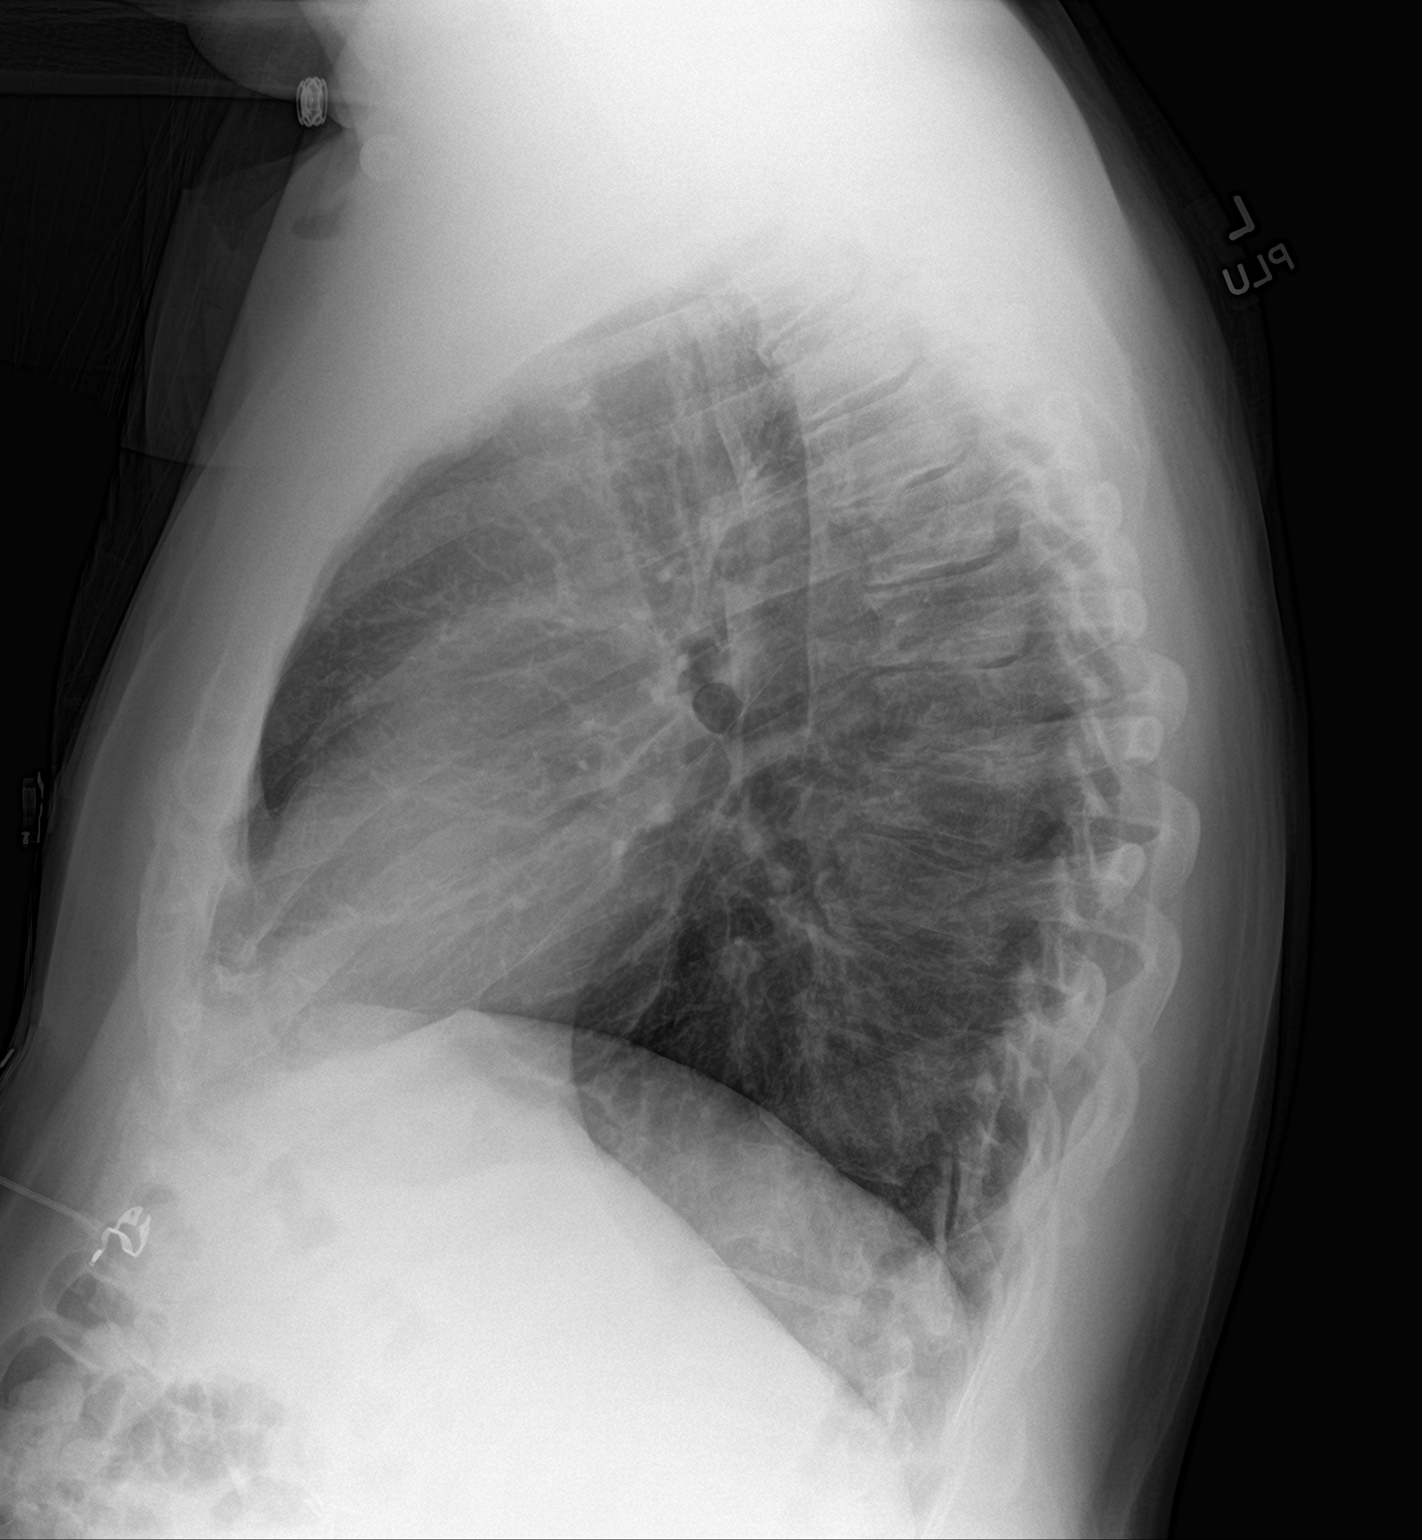

[2 of 2 positions shown; findings below may reference images not displayed]

FINDINGS: Previously noted left-sided pacemaker has been removed. Lung volumes
are normal. No consolidative airspace disease. No pleural effusions.
No pneumothorax. No pulmonary nodule or mass noted. Pulmonary
vasculature and the cardiomediastinal silhouette are within normal
limits. Orthopedic fixation hardware in the lower cervical spine
incidentally noted.
IMPRESSION: 1. Status post removal of left-sided pacemaker device with no acute
complicating features. Specifically, no pneumothorax.
